# Patient Record
Sex: Male | Born: 1975 | Race: Black or African American | Hispanic: No | Marital: Married | Smoking: Current every day smoker
Health system: Southern US, Community
[De-identification: ages and names within clinical notes are randomized; demographics above are authoritative.]

## PROBLEM LIST (undated history)

## (undated) DIAGNOSIS — E119 Type 2 diabetes mellitus without complications: Secondary | ICD-10-CM

## (undated) DIAGNOSIS — I10 Essential (primary) hypertension: Secondary | ICD-10-CM

## (undated) DIAGNOSIS — R51 Headache: Secondary | ICD-10-CM

## (undated) DIAGNOSIS — R519 Headache, unspecified: Secondary | ICD-10-CM

## (undated) HISTORY — PX: ANKLE SURGERY: SHX546

## (undated) HISTORY — PX: OTHER SURGICAL HISTORY: SHX169

---

## 2000-12-14 ENCOUNTER — Emergency Department (HOSPITAL_COMMUNITY): Admission: EM | Admit: 2000-12-14 | Discharge: 2000-12-15 | Payer: Self-pay | Admitting: Emergency Medicine

## 2001-04-11 ENCOUNTER — Emergency Department (HOSPITAL_COMMUNITY): Admission: EM | Admit: 2001-04-11 | Discharge: 2001-04-11 | Payer: Self-pay | Admitting: Emergency Medicine

## 2008-10-14 ENCOUNTER — Emergency Department (HOSPITAL_COMMUNITY): Admission: EM | Admit: 2008-10-14 | Discharge: 2008-10-15 | Payer: Self-pay | Admitting: Emergency Medicine

## 2008-10-17 ENCOUNTER — Emergency Department (HOSPITAL_COMMUNITY): Admission: EM | Admit: 2008-10-17 | Discharge: 2008-10-18 | Payer: Self-pay | Admitting: Emergency Medicine

## 2008-10-23 ENCOUNTER — Ambulatory Visit (HOSPITAL_BASED_OUTPATIENT_CLINIC_OR_DEPARTMENT_OTHER): Admission: RE | Admit: 2008-10-23 | Discharge: 2008-10-23 | Payer: Self-pay | Admitting: Urology

## 2009-01-04 ENCOUNTER — Emergency Department (HOSPITAL_COMMUNITY): Admission: EM | Admit: 2009-01-04 | Discharge: 2009-01-04 | Payer: Self-pay | Admitting: Family Medicine

## 2009-01-10 ENCOUNTER — Emergency Department (HOSPITAL_COMMUNITY): Admission: EM | Admit: 2009-01-10 | Discharge: 2009-01-11 | Payer: Self-pay | Admitting: Emergency Medicine

## 2009-01-14 ENCOUNTER — Ambulatory Visit (HOSPITAL_COMMUNITY): Admission: RE | Admit: 2009-01-14 | Discharge: 2009-01-14 | Payer: Self-pay | Admitting: Internal Medicine

## 2010-04-17 ENCOUNTER — Encounter: Payer: Self-pay | Admitting: Internal Medicine

## 2010-07-03 LAB — DIFFERENTIAL
Basophils Absolute: 0 10*3/uL (ref 0.0–0.1)
Basophils Absolute: 0 10*3/uL (ref 0.0–0.1)
Basophils Relative: 0 % (ref 0–1)
Eosinophils Absolute: 0.1 10*3/uL (ref 0.0–0.7)
Eosinophils Relative: 1 % (ref 0–5)
Lymphocytes Relative: 13 % (ref 12–46)
Lymphocytes Relative: 38 % (ref 12–46)
Monocytes Absolute: 0.5 10*3/uL (ref 0.1–1.0)
Monocytes Absolute: 0.6 10*3/uL (ref 0.1–1.0)
Monocytes Relative: 5 % (ref 3–12)
Monocytes Relative: 6 % (ref 3–12)
Neutro Abs: 9.1 10*3/uL — ABNORMAL HIGH (ref 1.7–7.7)
Neutrophils Relative %: 51 % (ref 43–77)
Neutrophils Relative %: 80 % — ABNORMAL HIGH (ref 43–77)

## 2010-07-03 LAB — CBC
HCT: 49 % (ref 39.0–52.0)
HCT: 52.9 % — ABNORMAL HIGH (ref 39.0–52.0)
MCHC: 32.5 g/dL (ref 30.0–36.0)
MCV: 84 fL (ref 78.0–100.0)
Platelets: 215 10*3/uL (ref 150–400)
RBC: 5.83 MIL/uL — ABNORMAL HIGH (ref 4.22–5.81)
RDW: 14.4 % (ref 11.5–15.5)
WBC: 11.4 10*3/uL — ABNORMAL HIGH (ref 4.0–10.5)
WBC: 8.3 10*3/uL (ref 4.0–10.5)

## 2010-07-03 LAB — URINALYSIS, ROUTINE W REFLEX MICROSCOPIC
Bilirubin Urine: NEGATIVE
Glucose, UA: NEGATIVE mg/dL
Ketones, ur: NEGATIVE mg/dL
Protein, ur: 100 mg/dL — AB
Specific Gravity, Urine: 1.011 (ref 1.005–1.030)
Urobilinogen, UA: 0.2 mg/dL (ref 0.0–1.0)
Urobilinogen, UA: 1 mg/dL (ref 0.0–1.0)

## 2010-07-03 LAB — GLUCOSE, CAPILLARY: Glucose-Capillary: 127 mg/dL — ABNORMAL HIGH (ref 70–99)

## 2010-07-03 LAB — POCT I-STAT, CHEM 8
BUN: 15 mg/dL (ref 6–23)
Calcium, Ion: 1.14 mmol/L (ref 1.12–1.32)
Chloride: 104 mEq/L (ref 96–112)
Chloride: 105 mEq/L (ref 96–112)
Creatinine, Ser: 1.2 mg/dL (ref 0.4–1.5)
Creatinine, Ser: 1.5 mg/dL (ref 0.4–1.5)
Hemoglobin: 19 g/dL — ABNORMAL HIGH (ref 13.0–17.0)
Sodium: 140 mEq/L (ref 135–145)
Sodium: 142 mEq/L (ref 135–145)

## 2010-07-03 LAB — URINE MICROSCOPIC-ADD ON

## 2010-07-03 LAB — URINE CULTURE
Colony Count: NO GROWTH
Colony Count: NO GROWTH

## 2010-08-09 NOTE — Op Note (Signed)
Gerald Moody, Gerald Moody                ACCOUNT NO.:  [id number]   MEDICAL RECORD NO.:  [id number]          PATIENT TYPE:  AMB   LOCATION:  [facility]                         FACILITY:  [facility]   PHYSICIAN:  Ronald L. Earlene Plater, M.D.  DATE OF BIRTH:  [birth year]   DATE OF PROCEDURE:  10/23/2008  DATE OF DISCHARGE:                               OPERATIVE REPORT   DIAGNOSES:  Left ureterolithiasis with multiple stones and left  hydronephrosis.   OPERATIVE PROCEDURE:  Cystourethroscopy, left rigid and flexible digital  ureterorenoscopy, holmium laser lithotripsy, basket stone extraction,  left retrograde ureteral pyelogram and placement of left double-J stent.   SURGEON:  Gaynelle Arabian.   ANESTHESIA:  LMA.   BLOOD LOSS:  Negligible.   TUBES:  26-cm 6-French contour double pigtail stent with pullout string.   COMPLICATIONS:  None.   INDICATIONS FOR PROCEDURE:  Gerald Moody is a very nice 35 year old white  male who presented with left flank pain and some nausea.  He  subsequently on CT scan was found have a 3-mm left distal ureteral  calculus and a 6-mm left UPJ stone.  He has not passed fragments  although he had some blood in his urine, and he thinks he may have  passed a small fragment.  He has not had much pain in the last few days.  With these 2 obstructive stones, we felt removal was indicated.  He  understands risks, benefits and alternatives and wishes to proceed.   PROCEDURE IN DETAIL:  The patient was placed in supine position.  After  proper LMA anesthesia, he was placed in the dorsal lithotomy position  and prepped and draped with Betadine in sterile fashion.  Cystourethroscopy was performed with a 22.5 [place] Olympus panendoscope.  The prostate was not enlarged, and the bladder was smooth walled without  lesions.  The urine had some blood, and there was some bloody efflux  from the left ureteral orifice.  A 0.038 French sensor wire was placed  into the left renal pelvis, and the distal  ureter was dilated with the  dilating portion of ureteral access catheter.  Ureteroscopy was then  performed with a short thin ureteroscope, and the distal stone was  visualized and noted to be approximately 4 mm in diameter.  It was  grasped with a nitinol basket and extracted.  Ureteroscopy was then  performed with the long thin ureteroscope.  We scoped up to the lower  ureter.  The stone could be seen at the ureteropelvic junction, but the  scope could not be passed any higher due to the length of the scope.  Scope was then visually removed.  A long digital ureteral access sheath  was placed up to the ureteropelvic junction, and the flexible digital  ureteroscope was performed.  The stone was in the renal pelvis, and it  was noted to be quite large, approximately 8-9 mm in diameter.  Utilizing the 200 micron laser fiber on a setting of 0.5 and a  repetition rate of 5, the stone was fragmented in multiple small  fragments, and each fragment was serially extracted with a  nitinol  basket.  Reinspection revealed there were no significant fragments  within the left renal pelvis.  Each calyces was visualized.  A 0.038  French sensor wire was then placed through the scope into the pelvis,  and the ureteroscope and the ureteral access sheath visualized all the  way out and no further fragments remaining within the ureter.  A 6-  [place] open-ended catheter was placed in the left renal pelvis with a  wire, and retrograde ureteral pyelogram was performed.  There was no  extravasation noted and no filling defects or other fragments.  The wire  was placed, and under fluoroscopic guidance, a 26-cm 6-French contour  double pigtail stent was placed and  noted to be in good position from the left renal pelvis within the  bladder.  Pullout string was attached.  The bladder was drained.  The  panendoscope was removed.  A string was taped to the penis.  The  location of stent was confirmed  fluoroscopically, and the patient was  taken to the recovery room stable.      Ronald L. Earlene Plater, M.D.  Electronically Signed     RLD/MEDQ  D:  10/23/2008  T:  10/24/2008  Job:  [id number]

## 2012-12-04 ENCOUNTER — Emergency Department (HOSPITAL_COMMUNITY)
Admission: EM | Admit: 2012-12-04 | Discharge: 2012-12-04 | Disposition: A | Discharge status: Home / Self Care | Payer: Managed Care, Other (non HMO) | Attending: Emergency Medicine | Admitting: Emergency Medicine

## 2012-12-04 ENCOUNTER — Encounter (HOSPITAL_COMMUNITY): Payer: Self-pay | Admitting: Emergency Medicine

## 2012-12-04 DIAGNOSIS — F17200 Nicotine dependence, unspecified, uncomplicated: Secondary | ICD-10-CM | POA: Insufficient documentation

## 2012-12-04 DIAGNOSIS — E119 Type 2 diabetes mellitus without complications: Secondary | ICD-10-CM | POA: Insufficient documentation

## 2012-12-04 DIAGNOSIS — R51 Headache: Secondary | ICD-10-CM | POA: Insufficient documentation

## 2012-12-04 DIAGNOSIS — H53149 Visual discomfort, unspecified: Secondary | ICD-10-CM | POA: Insufficient documentation

## 2012-12-04 DIAGNOSIS — R5381 Other malaise: Secondary | ICD-10-CM | POA: Insufficient documentation

## 2012-12-04 DIAGNOSIS — R209 Unspecified disturbances of skin sensation: Secondary | ICD-10-CM | POA: Insufficient documentation

## 2012-12-04 DIAGNOSIS — R112 Nausea with vomiting, unspecified: Secondary | ICD-10-CM | POA: Insufficient documentation

## 2012-12-04 HISTORY — DX: Type 2 diabetes mellitus without complications: E11.9

## 2012-12-04 HISTORY — DX: Headache: R51

## 2012-12-04 HISTORY — DX: Headache, unspecified: R51.9

## 2012-12-04 MED ORDER — SODIUM CHLORIDE 0.9 % IV BOLUS (SEPSIS)
1000.0000 mL | Freq: Once | INTRAVENOUS | Status: AC
  Administered 2012-12-04: 1000 mL via INTRAVENOUS

## 2012-12-04 MED ORDER — KETOROLAC TROMETHAMINE 30 MG/ML IJ SOLN
30.0000 mg | Freq: Once | INTRAMUSCULAR | Status: AC
  Administered 2012-12-04: 30 mg via INTRAVENOUS
  Filled 2012-12-04: qty 1

## 2012-12-04 MED ORDER — DIPHENHYDRAMINE HCL 50 MG/ML IJ SOLN
25.0000 mg | Freq: Once | INTRAMUSCULAR | Status: AC
  Administered 2012-12-04: 25 mg via INTRAVENOUS
  Filled 2012-12-04: qty 1

## 2012-12-04 MED ORDER — SUMATRIPTAN SUCCINATE 100 MG PO TABS: 100.0000 mg | ORAL_TABLET | ORAL | Status: DC | PRN

## 2012-12-04 MED ORDER — METOCLOPRAMIDE HCL 5 MG/ML IJ SOLN
10.0000 mg | Freq: Once | INTRAMUSCULAR | Status: AC
  Administered 2012-12-04: 10 mg via INTRAVENOUS
  Filled 2012-12-04: qty 2

## 2012-12-04 MED ORDER — DEXAMETHASONE SODIUM PHOSPHATE 10 MG/ML IJ SOLN
10.0000 mg | Freq: Once | INTRAMUSCULAR | Status: AC
  Administered 2012-12-04: 10 mg via INTRAVENOUS
  Filled 2012-12-04: qty 1

## 2012-12-04 NOTE — ED Provider Notes (Signed)
CSN: [id number]     Arrival date & time 12/04/12  2005 History   First MD Initiated Contact with Patient 12/04/12 2212     Chief Complaint  Patient presents with  . Headache   HPI  History provided by the patient. The patient is a 37 year old male with history of diabetes and previous migraine-type headaches who presents with complaints of similar migraine headache. Headache has been present with some waxing and waning features for the past 4 days. Patient states he initially had very severe pain to the left side of his head and behind his left eye lasting 15-20 minutes. Headache is preceded by any tingling and hot sensation to his left maxillary area. Since then the headache has been coming and going with varying intensities. Patient did try to take aspirin earlier today but reports he has had associated nausea and vomiting and did not keep this down. He has had multiple episodes of vomiting with decreased appetite today. No diarrhea. No fever, chills or sweats. No weakness or numbness in extremities. No confusion. Symptoms are worsened with some movements and bright lights. Patient does report using Imitrex in the past which did tend to help with his symptoms at home.    Past Medical History  Diagnosis Date  . Headache   . Diabetes mellitus without complication    History reviewed. No pertinent past surgical history. No family history on file. History  Substance Use Topics  . Smoking status: Current Every Day Smoker  . Smokeless tobacco: Not on file  . Alcohol Use: Yes    Review of Systems  Constitutional: Positive for fatigue. Negative for fever and diaphoresis.  Eyes: Positive for photophobia. Negative for visual disturbance.  Gastrointestinal: Positive for nausea and vomiting.  Neurological: Positive for headaches. Negative for numbness.  All other systems reviewed and are negative.    Allergies  Review of patient's allergies indicates no known allergies.  Home Medications    Current Outpatient Rx  Name  Route  Sig  Dispense  Refill  . latanoprost (XALATAN) 0.005 % ophthalmic solution   Both Eyes   Place 1 drop into both eyes at bedtime.         . metFORMIN (GLUCOPHAGE) 500 MG tablet   Oral   Take 500 mg by mouth 2 (two) times daily with a meal.          BP 142/90  Pulse 88  Temp(Src) 98.9 F (37.2 C) (Oral)  Resp 16  Ht 5\' 10"  (1.778 m)  Wt 202 lb (91.627 kg)  BMI 28.98 kg/m2  SpO2 98% Physical Exam  Nursing note and vitals reviewed. Constitutional: He is oriented to person, place, and time. He appears well-developed and well-nourished. No distress.  HENT:  Head: Normocephalic and atraumatic.  No sinus pressure or nasal congestion  Eyes: Conjunctivae and EOM are normal. Pupils are equal, round, and reactive to light.  Neck: Normal range of motion. Neck supple.  No meningeal sign  Cardiovascular: Normal rate and regular rhythm.   Pulmonary/Chest: Effort normal and breath sounds normal. No respiratory distress. He has no wheezes.  Abdominal: Soft. There is no tenderness.  Musculoskeletal: Normal range of motion. He exhibits no edema and no tenderness.  Neurological: He is alert and oriented to person, place, and time. He has normal strength. No cranial nerve deficit or sensory deficit. Coordination and gait normal.  Skin: Skin is warm. No rash noted.  Psychiatric: He has a normal mood and affect. His behavior is normal.  ED Course  Procedures      MDM   1. Headache      10:10PM patient seen and evaluated. Patient well-appearing no acute distress. Does not appear severely ill or toxic. He has a normal nonfocal neuro exam. Headaches are similar to previous headaches. 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 Migraine cocktail ordered.  11:00PM Pt feeling much better.  He is ready to go home.    Angus Seller, PA-C 12/04/12 2314

## 2012-12-04 NOTE — ED Notes (Signed)
Pt. Stated, I've been having headaches since Monday.  I 've not taking anything until today and I took ASA and threw it back up.  I've had this when we change the season it comes on for the last 4 years.

## 2012-12-05 NOTE — ED Provider Notes (Signed)
Medical screening examination/treatment/procedure(s) were performed by non-physician practitioner and as supervising physician I was immediately available for consultation/collaboration.   Nelia Shi, MD 12/05/12 [phone]

## 2012-12-12 ENCOUNTER — Emergency Department (HOSPITAL_COMMUNITY): Payer: Managed Care, Other (non HMO)

## 2012-12-12 ENCOUNTER — Emergency Department (HOSPITAL_COMMUNITY)
Admission: EM | Admit: 2012-12-12 | Discharge: 2012-12-12 | Disposition: A | Discharge status: Home / Self Care | Payer: Managed Care, Other (non HMO) | Attending: Emergency Medicine | Admitting: Emergency Medicine

## 2012-12-12 ENCOUNTER — Encounter (HOSPITAL_COMMUNITY): Payer: Self-pay

## 2012-12-12 DIAGNOSIS — F17200 Nicotine dependence, unspecified, uncomplicated: Secondary | ICD-10-CM | POA: Insufficient documentation

## 2012-12-12 DIAGNOSIS — H53149 Visual discomfort, unspecified: Secondary | ICD-10-CM | POA: Insufficient documentation

## 2012-12-12 DIAGNOSIS — Z79899 Other long term (current) drug therapy: Secondary | ICD-10-CM | POA: Insufficient documentation

## 2012-12-12 DIAGNOSIS — G44009 Cluster headache syndrome, unspecified, not intractable: Secondary | ICD-10-CM

## 2012-12-12 DIAGNOSIS — J069 Acute upper respiratory infection, unspecified: Secondary | ICD-10-CM

## 2012-12-12 DIAGNOSIS — R112 Nausea with vomiting, unspecified: Secondary | ICD-10-CM | POA: Insufficient documentation

## 2012-12-12 DIAGNOSIS — H538 Other visual disturbances: Secondary | ICD-10-CM | POA: Insufficient documentation

## 2012-12-12 DIAGNOSIS — E119 Type 2 diabetes mellitus without complications: Secondary | ICD-10-CM | POA: Insufficient documentation

## 2012-12-12 MED ORDER — KETOROLAC TROMETHAMINE 30 MG/ML IJ SOLN
30.0000 mg | Freq: Once | INTRAMUSCULAR | Status: AC
  Administered 2012-12-12: 30 mg via INTRAVENOUS
  Filled 2012-12-12: qty 1

## 2012-12-12 MED ORDER — DIPHENHYDRAMINE HCL 50 MG/ML IJ SOLN
25.0000 mg | Freq: Once | INTRAMUSCULAR | Status: AC
  Administered 2012-12-12: 25 mg via INTRAVENOUS
  Filled 2012-12-12: qty 1

## 2012-12-12 MED ORDER — METOCLOPRAMIDE HCL 5 MG/ML IJ SOLN
10.0000 mg | Freq: Once | INTRAMUSCULAR | Status: AC
  Administered 2012-12-12: 10 mg via INTRAVENOUS
  Filled 2012-12-12: qty 2

## 2012-12-12 NOTE — ED Notes (Signed)
Marissa,PA at the bedside.  

## 2012-12-12 NOTE — ED Notes (Signed)
Patient c/o headache that started at 0200 this morning, patient was in last week for the same problem,

## 2012-12-12 NOTE — ED Notes (Signed)
Returned from xray

## 2012-12-12 NOTE — ED Notes (Signed)
Patient was taking Imitrex at home, but it was making the pt feel weak and drowsy, he no longer has any pills left. Pt developed cough with congestion yesterday

## 2012-12-12 NOTE — ED Provider Notes (Signed)
CSN: [id number]     Arrival date & time 12/12/12  2956 History   First MD Initiated Contact with Patient 12/12/12 [phone]     Chief Complaint  Patient presents with  . Headache  . Emesis   (Consider location/radiation/quality/duration/timing/severity/associated sxs/prior Treatment) The history is provided by the patient. No language interpreter was used.  Gerald Moody is a 37 y/o M with PMHx of headaches and DM presenting to the ED with headache that has been ongoing for the past 2 weeks, intermittently, episodes of headaches that have been occurring everyday lasting approximately 1 hour. Patient reported that the headache is localized to the left side of the head with feelings of pressure behind the left eye with mild tearing and swelling identified as per patient. Patient reported that he has been having photophobia, phonophobia, and mild blurred vision. Reported that he has been feel nauseous intermittently throughout the day with emesis one time this morning at approximately 3:00AM. Reported that his last headache started at 2:00AM and has been consistent since then. Patient reported that he was diagnosed with cluster headaches 4 years ago, does not take medications for it. Reported that he had been having chest soreness, associated with when the patient coughs. Reported that he has been coughing yesterday, coughing up yellow phlegm. Reported that he does have nasal congestion with yellow mucus. Reported that he has been having facial pressure bilaterally with chills. Denied facial numbness, tingling, facial drooping, slurred speech, neck stiffness, sick contacts, sore throat, difficulty swallowing, falls, head injury, spots in vision, weakness, sudden loss of vision. PCP none   Past Medical History  Diagnosis Date  . Headache   . Diabetes mellitus without complication    History reviewed. No pertinent past surgical history. History reviewed. No pertinent family history. History  Substance Use  Topics  . Smoking status: Current Every Day Smoker  . Smokeless tobacco: Not on file  . Alcohol Use: Yes    Review of Systems  Constitutional: Positive for chills. Negative for fever.  HENT: Negative for neck pain and neck stiffness.   Eyes: Positive for visual disturbance.  Respiratory: Positive for cough. Negative for chest tightness.   Cardiovascular: Negative for chest pain.  Gastrointestinal: Positive for nausea and vomiting. Negative for abdominal pain.  Neurological: Positive for headaches. Negative for dizziness, weakness and numbness.  All other systems reviewed and are negative.    Allergies  Review of patient's allergies indicates no known allergies.  Home Medications   Current Outpatient Rx  Name  Route  Sig  Dispense  Refill  . latanoprost (XALATAN) 0.005 % ophthalmic solution   Both Eyes   Place 1 drop into both eyes at bedtime.         . metFORMIN (GLUCOPHAGE) 500 MG tablet   Oral   Take 500 mg by mouth 2 (two) times daily with a meal.         . SUMAtriptan (IMITREX) 100 MG tablet   Oral   Take 1 tablet (100 mg total) by mouth every 4 (four) hours as needed for migraine.   10 tablet   0    BP 122/77  Pulse 72  Temp(Src) 99.8 F (37.7 C) (Oral)  Resp 16  Ht 5\' 10"  (1.778 m)  Wt 202 lb (91.627 kg)  BMI 28.98 kg/m2  SpO2 98% Physical Exam  Nursing note and vitals reviewed. Constitutional: He is oriented to person, place, and time. He appears well-developed and well-nourished. No distress.  HENT:  Head: Normocephalic and  atraumatic.  Mouth/Throat: Oropharynx is clear and moist. No oropharyngeal exudate.  Discomfort upon palpation to the maxillary and frontal sinuses Nasal congestion identified  Eyes: Conjunctivae and EOM are normal. Pupils are equal, round, and reactive to light. Right eye exhibits no discharge. Left eye exhibits no discharge.  Negative swelling, erythema noted to the eyes bilaterally Mild lacrimation noted to the left eye.  Mild tearing noted.  Neck: Normal range of motion. Neck supple.  Negative neck stiffness Negative nuchal rigidity Negative pain upon palpation to the cervical spine Negative lymphadenopathy noted  Cardiovascular: Normal rate, regular rhythm and normal heart sounds.  Exam reveals no friction rub.   No murmur heard. Pulses:      Radial pulses are 2+ on the right side, and 2+ on the left side.       Dorsalis pedis pulses are 2+ on the right side, and 2+ on the left side.  Pulmonary/Chest: Effort normal and breath sounds normal. No respiratory distress. He has no wheezes. He has no rales.  Negative signs of respiratory distress.  Musculoskeletal: Normal range of motion.  Lymphadenopathy:    He has no cervical adenopathy.  Neurological: He is alert and oriented to person, place, and time. No cranial nerve deficit. He exhibits normal muscle tone. Coordination normal. GCS eye subscore is 4. GCS verbal subscore is 5. GCS motor subscore is 6.  Cranial nerves III-XII grossly intact  Sensation intact to upper and lower extremities Strength 5+/5+ to upper and lower extremities bilaterally with resistance applied, equal distribution noted Negative facial drooping, negative slurred speech Negative arm drift Responds to questions appropriately  Follows commands appropriately  Skin: Skin is warm and dry. He is not diaphoretic.  Psychiatric: He has a normal mood and affect. His behavior is normal. Thought content normal.    ED Course  Procedures (including critical care time)  This provider reviewed patient's chart. Patient was seen on 12/04/2012 for same complaint regarding headache. Patient reported that the headache has been intermittent with waxing and waning episodes of headache localized to the left side with eye discomfort, rhinorrhea, lacrimation noted.  11:33 AM Spoke with patient. Patient reported that he is feeling better, that is headache is a 2/10. Negative episodes of emesis. Nausea  controlled. Patient able to tolerate fluids by mouth.  Discussed case with Dr. Leander Rams, as per physician did not recommend CT head to be performed at this time.   Labs Review Labs Reviewed - No data to display Imaging Review Dg Chest 2 View  12/12/2012   CLINICAL DATA:  Chest pain, headache.  EXAM: CHEST  2 VIEW  COMPARISON:  10/23/2008  FINDINGS: Mild peribronchial thickening. Heart and mediastinal contours are within normal limits. No focal opacities or effusions. No acute bony abnormality.  IMPRESSION: Bronchitic changes.   Electronically Signed   By: Charlett Nose M.D.   On: 12/12/2012 09:37    MDM   1. Cluster headache   2. URI (upper respiratory infection)     Patient presenting to emergency department with headache that has been ongoing for the past 2 weeks. Patient reports that he gets these headaches every other month, reported that the headache is the same presentation as his normal headache that he gets monthly. Denied maintenance therapy. Alert and oriented. Eyes normal, PERRLA bilaterally. Negative neck stiffness, negative nuchal rigidity, negative cervical lymphadenopathy-negative pain upon palpation to cervical spine. Full range of motion to upper and lower extremities bilaterally. Cranial nerves grossly intact. Negative neurological deficits identified. Fine  motor skills intact. Responds to questions appropriately. Follows commands properly. Patient stable, afebrile. Headache controlled in ED setting. Negative neurological deficits identified. Negative meningeal signs. Doubt SAH. Doubt ICH. Doubt stroke. Doubt meningitis. Suspicion to be cluster headache presentation, patient has long history approximately 4 years this has been ongoing. Suspicion to be upper respiratory infection, viral in nature. Doubt ACS, reported that chest pain is associated when patient coughs only. Discharged patient. Discussed with patient to continue to take sumatriptan that he was prescribed from previous  visit in the emergency department on 12/04/2012. [facility]-discussed with patient he will most likely need to be placed on maintenance therapy regarding control of his cluster headaches. Discussed with patient to rest and stay hydrated. Discussed with patient to continue to monitor symptoms and if symptoms are to worsen or change to report back to emergency department-strict return instructions given. Patient agreed to plan of care, understood, all questions answered.    Raymon Mutton, PA-C 12/12/12 1857

## 2012-12-14 NOTE — ED Provider Notes (Signed)
Medical screening examination/treatment/procedure(s) were performed by non-physician practitioner and as supervising physician I was immediately available for consultation/collaboration.   Gavin Pound. Shaday Rayborn, MD 12/14/12 1550

## 2013-01-06 ENCOUNTER — Emergency Department (HOSPITAL_COMMUNITY)
Admission: EM | Admit: 2013-01-06 | Discharge: 2013-01-07 | Disposition: A | Discharge status: Home / Self Care | Payer: Managed Care, Other (non HMO) | Attending: Emergency Medicine | Admitting: Emergency Medicine

## 2013-01-06 ENCOUNTER — Encounter (HOSPITAL_COMMUNITY): Payer: Self-pay | Admitting: Emergency Medicine

## 2013-01-06 DIAGNOSIS — F17200 Nicotine dependence, unspecified, uncomplicated: Secondary | ICD-10-CM | POA: Insufficient documentation

## 2013-01-06 DIAGNOSIS — R51 Headache: Secondary | ICD-10-CM | POA: Insufficient documentation

## 2013-01-06 DIAGNOSIS — E119 Type 2 diabetes mellitus without complications: Secondary | ICD-10-CM | POA: Insufficient documentation

## 2013-01-06 DIAGNOSIS — Z79899 Other long term (current) drug therapy: Secondary | ICD-10-CM | POA: Insufficient documentation

## 2013-01-06 DIAGNOSIS — Z720 Tobacco use: Secondary | ICD-10-CM

## 2013-01-06 LAB — GLUCOSE, CAPILLARY

## 2013-01-06 MED ORDER — KETOROLAC TROMETHAMINE 30 MG/ML IJ SOLN
15.0000 mg | Freq: Once | INTRAMUSCULAR | Status: DC
  Filled 2013-01-06: qty 1

## 2013-01-06 MED ORDER — SODIUM CHLORIDE 0.9 % IV BOLUS (SEPSIS): 1000.0000 mL | Freq: Once | INTRAVENOUS | Status: DC

## 2013-01-06 MED ORDER — PROCHLORPERAZINE EDISYLATE 5 MG/ML IJ SOLN
10.0000 mg | Freq: Once | INTRAMUSCULAR | Status: DC
  Filled 2013-01-06: qty 2

## 2013-01-06 NOTE — ED Provider Notes (Signed)
CSN: [id number]     Arrival date & time 01/06/13  1821 History   First MD Initiated Contact with Patient 01/06/13 2237     Chief Complaint  Patient presents with  . Headache   (Consider location/radiation/quality/duration/timing/severity/associated sxs/prior Treatment) HPI  Gerald Moody is a 37 y.o. male past medical history significant for non-insulin-dependent diabetes complaining of typical headache which she has had for over a month. Patient presents to the ED because he wants his head looks that any further evaluation of his brain. Patient has called his primary care doctor and has asked for neuro referral but this has not happened. Patient has been out of work since September 9 due to the severity of the headache. Patient has left-sided periorbital and temporal headache intermittently, with photophobia and nausea and vomiting. Pain is alleviated with Imitrex and home O2. Patient denies fever, cervicalgia, confusion, acute onset, change in character, location or severity of headache.   Past Medical History  Diagnosis Date  . Headache   . Diabetes mellitus without complication    History reviewed. No pertinent past surgical history. History reviewed. No pertinent family history. History  Substance Use Topics  . Smoking status: Current Every Day Smoker  . Smokeless tobacco: Not on file  . Alcohol Use: Yes    Review of Systems 10 systems reviewed and found to be negative, except as noted in the HPI  Allergies  Review of patient's allergies indicates no known allergies.  Home Medications   Current Outpatient Rx  Name  Route  Sig  Dispense  Refill  . latanoprost (XALATAN) 0.005 % ophthalmic solution   Both Eyes   Place 1 drop into both eyes at bedtime.         . metFORMIN (GLUCOPHAGE) 500 MG tablet   Oral   Take 500 mg by mouth 2 (two) times daily with a meal.         . SUMAtriptan (IMITREX) 100 MG tablet   Oral   Take 1 tablet (100 mg total) by mouth every 4 (four)  hours as needed for migraine.   10 tablet   0    BP 149/101  Pulse 69  Temp(Src) 97.9 F (36.6 C) (Oral)  Resp 18  Ht 5\' 10"  (1.778 m)  Wt 191 lb (86.637 kg)  BMI 27.41 kg/m2  SpO2 97% Physical Exam  Nursing note and vitals reviewed. Constitutional: He is oriented to person, place, and time. He appears well-developed and well-nourished. No distress.  HENT:  Head: Normocephalic and atraumatic.  Mouth/Throat: Oropharynx is clear and moist.  Eyes: Conjunctivae and EOM are normal. Pupils are equal, round, and reactive to light. Right eye exhibits no discharge. Left eye exhibits no discharge.  Neck: Normal range of motion. Neck supple.  No midline tenderness to palpation or step-offs appreciated. Patient has full range of motion without pain.   Cardiovascular: Normal rate, regular rhythm and intact distal pulses.   Pulmonary/Chest: Effort normal and breath sounds normal. No stridor. No respiratory distress. He has no wheezes. He has no rales. He exhibits no tenderness.  Abdominal: Soft. He exhibits no distension and no mass. There is no tenderness. There is no rebound and no guarding.  Musculoskeletal: Normal range of motion. He exhibits no edema.  Neurological: He is alert and oriented to person, place, and time. He displays normal reflexes. No cranial nerve deficit. He exhibits normal muscle tone. Coordination normal.  Cranial nerves III through XII intact, strength 5 out of 5x4 extremities, negative pronator drift,  finger to nose and heel-to-shin coordinated, sensation intact to pinprick and light touch, gait is coordinated and Romberg is negative.    Psychiatric: He has a normal mood and affect.    ED Course  Procedures (including critical care time) Labs Review Labs Reviewed  GLUCOSE, CAPILLARY - Abnormal; Notable for the following:    Glucose-Capillary 178 (*)    All other components within normal limits   Imaging Review No results found.  EKG Interpretation   None        MDM   1. Headache   2. Tobacco use     Filed Vitals:   01/06/13 1840  BP: 159/102  Pulse: 74  Temp: 98.2 F (36.8 C)  TempSrc: Oral  Resp: 18  Height: 5\' 10"  (1.778 m)  Weight: 191 lb (86.637 kg)  SpO2: 98%     Gerald Moody is a 37 y.o. male with  HA treated and improved while in ED.  Presentation is like pts typical HA and non concerning for [facility], ICH, Meningitis, or temporal arteritis. Pt is afebrile with no focal neuro deficits, nuchal rigidity, or change in vision. Patient states he would like to get to the bottom of his chronic headaches. I have explained to him that he must follow with neurology as an outpatient. I am happy to give him a referral for this. He has deferred pain medications in the ED. Pt verbalizes understanding and is agreeable with plan to dc.   Pt is hemodynamically stable, appropriate for, and amenable to discharge at this time. Pt verbalized understanding and agrees with care plan. All questions answered. Outpatient follow-up and specific return precautions discussed.    New Prescriptions   BUTALBITAL-ACETAMINOPHEN-CAFFEINE (FIORICET) 50-325-40 MG PER TABLET    Take 1 tablet by mouth every 6 (six) hours as needed for headache.    Note: Portions of this report may have been transcribed using voice recognition software. Every effort was made to ensure accuracy; however, inadvertent computerized transcription errors may be present        Wynetta Emery, PA-C 01/07/13 0011

## 2013-01-06 NOTE — ED Notes (Addendum)
Pt refusing IV and IV pain meds, states "that's what they do every time I come here, I want to be scanned, I don't want IV medication that drugs me up."

## 2013-01-06 NOTE — ED Notes (Signed)
Pt c/o left sided HA intermittently x 1 month; pt seen here for same x 3; pt sts hx of DM

## 2013-01-06 NOTE — ED Notes (Signed)
EDPA Nicole at bedside. 

## 2013-01-06 NOTE — ED Notes (Signed)
Pt reports intermittent Left side headache behind his eye, jaw bone, posterior neck 2-3 times pt reports having these symptoms on and off x1 month. Pt denies N/V or sensitivity to light.

## 2013-01-07 MED ORDER — BUTALBITAL-APAP-CAFFEINE 50-325-40 MG PO TABS: 1.0000 | ORAL_TABLET | Freq: Four times a day (QID) | ORAL | Status: DC | PRN

## 2013-01-07 NOTE — ED Provider Notes (Signed)
Medical screening examination/treatment/procedure(s) were performed by non-physician practitioner and as supervising physician I was immediately available for consultation/collaboration.   Junius Argyle, MD 01/07/13 [phone]

## 2013-01-07 NOTE — ED Notes (Signed)
Nicole at bedside, thoroughly explained to pt why he would not be receiving a CT scan of his head tonight in the ED, she would give him a referral to a neurologist to have further evaluation.

## 2013-01-08 ENCOUNTER — Encounter (INDEPENDENT_AMBULATORY_CARE_PROVIDER_SITE_OTHER): Payer: Self-pay

## 2013-01-08 ENCOUNTER — Encounter: Payer: Self-pay | Admitting: Neurology

## 2013-01-08 ENCOUNTER — Ambulatory Visit (INDEPENDENT_AMBULATORY_CARE_PROVIDER_SITE_OTHER): Payer: Managed Care, Other (non HMO) | Admitting: Neurology

## 2013-01-08 VITALS — BP 153/101 | HR 62 | Ht 70.0 in | Wt 190.0 lb

## 2013-01-08 VITALS — Ht 68.0 in | Wt 190.0 lb

## 2013-01-08 DIAGNOSIS — G44009 Cluster headache syndrome, unspecified, not intractable: Secondary | ICD-10-CM | POA: Insufficient documentation

## 2013-01-08 MED ORDER — RIZATRIPTAN BENZOATE 5 MG PO TBDP: 5.0000 mg | ORAL_TABLET | ORAL | Status: DC | PRN

## 2013-01-08 MED ORDER — LIDOCAINE HCL 2 % IJ SOLN: 5.0000 mL | Freq: Once | INTRAMUSCULAR | Status: DC

## 2013-01-08 MED ORDER — TOPIRAMATE ER 100 MG PO CAP24: 100.0000 mg | ORAL_CAPSULE | Freq: Every day | ORAL | Status: DC

## 2013-01-08 NOTE — Progress Notes (Signed)
[facility]  PATIENT: Yunus Stoklosa DOB: 1976-02-07  HISTORICAL Daisuke is a 37 years old right-handed African American male, referred by emergency room for evaluation of headaches  He had past medical history of hypertension, diabetes, presenting with left-sided headaches in September 2014, he complains of left frontal area severe pounding headache, with associated left-sided tearing, running nose, radiating pain to his left parietal region, left shoulder, left arm. He also complains of nausea vomitting during the episodes    He has 2-3 episodes of headache each day, it is usually happened late night, or early morning times, each episode lasted about 20-30 minutes, oxygen helps, Imitrex helps too. He tends to pace around during his headaches   REVIEW OF SYSTEMS: Full 14 system review of systems performed and notable only for weight loss, fatigue, blurry vision, feeling hot, change in appetite, confusion, headaches, weakness, sleepiness  ALLERGIES: No Known Allergies  HOME MEDICATIONS: Outpatient Prescriptions Prior to Visit  Medication Sig Dispense Refill  . butalbital-acetaminophen-caffeine (FIORICET) 50-325-40 MG per tablet Take 1 tablet by mouth every 6 (six) hours as needed for headache.  15 tablet  0  . latanoprost (XALATAN) 0.005 % ophthalmic solution Place 1 drop into both eyes at bedtime.      . metFORMIN (GLUCOPHAGE) 500 MG tablet Take 500 mg by mouth 2 (two) times daily with a meal.      . SUMAtriptan (IMITREX) 100 MG tablet Take 1 tablet (100 mg total) by mouth every 4 (four) hours as needed for migraine.  10 tablet  0   No facility-administered medications prior to visit.    PAST MEDICAL HISTORY: Past Medical History  Diagnosis Date  . Headache   . Diabetes mellitus without complication     PAST SURGICAL HISTORY: Past Surgical History  Procedure Laterality Date  . Ankle surgery Left   . Kidney stones      FAMILY HISTORY: Family History    Problem Relation Age of Onset  . Diabetes Mother   . Diabetes Father   . High blood pressure Father     SOCIAL HISTORY:  History   Social History  . Marital Status: Married    Spouse Name: [name]    Number of Children: 2  . Years of Education: 12   Occupational History  .  [employer]   Social History Main Topics  . Smoking status: Current Every Day Smoker -- 10 years    Types: Cigarettes  . Smokeless tobacco: Never Used  . Alcohol Use: 0.6 oz/week    1 Shots of liquor per week     Comment: Rare  . Drug Use: No  . Sexual Activity: Not on file   Other Topics Concern  . Not on file   Social History Narrative   Patient lives at home with his wife [name]). Patient works full time.    High school education   Right handed.   Caffeine.One soda daily     PHYSICAL EXAM   Filed Vitals:   01/08/13 0848  Height: 5\' 8"  (1.727 m)  Weight: 190 lb (86.183 kg)   Body mass index is 28.9 kg/(m^2).   Generalized: In no acute distress  Neck: Supple, no carotid bruits   Cardiac: Regular rate rhythm  Pulmonary: Clear to auscultation bilaterally  Musculoskeletal: No deformity  Neurological examination  Mentation: Alert oriented to time, place, history taking, and causual conversation  Cranial nerve II-XII: Pupils were equal round reactive to light extraocular movements were full, visual  field were full on confrontational test. facial sensation and strength were normal. hearing was intact to finger rubbing bilaterally. Uvula tongue midline.  head turning and shoulder shrug and were normal and symmetric.Tongue protrusion into cheek strength was normal.  Motor: normal tone, bulk and strength.  Sensory: Intact to fine touch, pinprick, preserved vibratory sensation, and proprioception at toes.  Coordination: Normal finger to nose, heel-to-shin bilaterally there was no truncal ataxia  Gait: Rising up from seated position without assistance, normal stance, without trunk  ataxia, moderate stride, good arm swing, smooth turning, able to perform tiptoe, and heel walking without difficulty.   Romberg signs: Negative  Deep tendon reflexes: Brachioradialis 2/2, biceps 2/2, triceps 2/2, patellar 2/2, Achilles 2/2, plantar responses were flexor bilaterally.   DIAGNOSTIC DATA (LABS, IMAGING, TESTING) - I reviewed patient records, labs, notes, testing and imaging myself where available.  Lab Results  Component Value Date   WBC 8.3 10/18/2008   HGB 16.7 10/18/2008   HCT 49.0 10/18/2008   MCV 84.0 10/18/2008   PLT 215 10/18/2008      Component Value Date/Time   NA 140 10/18/2008 0104   K 4.0 10/18/2008 0104   CL 105 10/18/2008 0104   GLUCOSE 160* 10/18/2008 0104   BUN 15 10/18/2008 0104   CREATININE 1.2 10/18/2008 0104    ASSESSMENT AND PLAN  37 years old right-handed [place] American male, with cluster headache, involving his left side, normal neurological examination.  1. MRI of brain to rule out structural lesion. 2. Sphenocath blocking procedure 3. Trokendi xr 100mg  qday, Maxalt prn   Levert Feinstein, M.D. Ph.D.  [facility] [address], [address] [place], [place] [address] [phone]

## 2013-01-08 NOTE — Progress Notes (Signed)
[facility]   Provider:  Dr Hosie Poisson Referring Provider: Jackie Plum, MD Primary Care Physician:  Jackie Plum, MD  CC:  SPG block for cluster headache  HPI:  Gerald Moody is a 37 y.o. male here for initial evaluation for SPG block for cluster headache. Referral from Dr Terrace Arabia.   Patient notes severe refractory headaches, responsive to oxygen therapy but no other medications. Having autonomic features with the headache.   Contraindications and precautions discussed with patient. Aseptic procedure was observed and patient tolerated procedure. Procedure performed by Dr. Elspeth Cho.  The condition has existed for more than 6 months   Indication/Diagnosis:  Cluster headaches  Exam: Gen:NAD, pleasant  Neuro: Alert, oriented x 3, follows simple and mult step commands  CN: PERRL, EOMI, no facial asymmetry  Motor: moves all extremities spontaneously   Procedure Report:  Patient taken to procedure room, placed on table in supine position with a pillow supporting the shoulders, and allowing the back of the head to rest on the procedural table, which facilitated gentle neck extension.  Alcohol wipes were generously applied to the cutaneous structures of the maxillary region of the face. A temperature reading sticker was placed on both sides of the face, in the V2 distribution and baseline cutaneous temperatures were recorded. Right was 92, left was 92.  Both nostrils were then visually examined to check for signs of recent epistaxis, purulent nasal drainage, and/or visible nasal sinus anatomy abnormalities or nares obstruction. No abnormalities were visualized. Just prior to insertion of sphenocath, the patients head was rechecked for proper supine position. Spheno cath was attached to syringe containing 5cc of 2% lidocaine, was dipped into lubricating jelly and introduced into the right nostril, through the right internal meatus, into the right lateral middle nasal  cavity. 2.5cc of lidocaine was injected once proper position was obtained. Patient tolerated well. After injection noted bilateral conjunctival injection, tearing of bilateral eyes and increase in temperature to 96 degrees on the right.  Procedure repeated on the left, with similar results, including tearing and bilateral conjunctival injection, temperature increased to 95 degrees.  Post injections, patient remained in supine position for 10 minutes. No complications noted. Patient did note discomfort during the procedure but otherwise tolerated it well.   History   Social History  . Marital Status: Married    Spouse Name: [name]    Number of Children: 2  . Years of Education: 12   Occupational History  .  [employer]   Social History Main Topics  . Smoking status: Current Every Day Smoker -- 10 years    Types: Cigarettes  . Smokeless tobacco: Never Used  . Alcohol Use: 0.6 oz/week    1 Shots of liquor per week     Comment: Rare  . Drug Use: No  . Sexual Activity: Not on file   Other Topics Concern  . Not on file   Social History Narrative   Patient lives at home with his wife [name]). Patient works full time.    High school education   Right handed.   Caffeine.One soda daily    Family History  Problem Relation Age of Onset  . Diabetes Mother   . Diabetes Father   . High blood pressure Father     Past Medical History  Diagnosis Date  . Headache   . Diabetes mellitus without complication     Past Surgical History  Procedure Laterality Date  . Ankle surgery Left   . Kidney stones  Current Outpatient Prescriptions  Medication Sig Dispense Refill  . butalbital-acetaminophen-caffeine (FIORICET) 50-325-40 MG per tablet Take 1 tablet by mouth every 6 (six) hours as needed for headache.  15 tablet  0  . latanoprost (XALATAN) 0.005 % ophthalmic solution Place 1 drop into both eyes at bedtime.      . metFORMIN (GLUCOPHAGE) 500 MG tablet Take 500 mg by mouth 2  (two) times daily with a meal.      . rizatriptan (MAXALT-MLT) 5 MG disintegrating tablet Take 1 tablet (5 mg total) by mouth as needed for migraine. May repeat in 2 hours if needed  15 tablet  12  . SUMAtriptan (IMITREX) 100 MG tablet Take 1 tablet (100 mg total) by mouth every 4 (four) hours as needed for migraine.  10 tablet  0  . Topiramate ER 100 MG CP24 Take 100 mg by mouth at bedtime.  30 capsule  12   Current Facility-Administered Medications  Medication Dose Route Frequency Provider Last Rate Last Dose  . lidocaine (XYLOCAINE) 2 % (with pres) injection 100 mg  5 mL Intradermal Once Omelia Blackwater, DO        Allergies as of 01/08/2013  . (No Known Allergies)    Vitals: BP 153/101  Pulse 62  Ht 5\' 10"  (1.778 m)  Wt 190 lb (86.183 kg)  BMI 27.26 kg/m2 Last Weight:  Wt Readings from Last 1 Encounters:  01/08/13 190 lb (86.183 kg)   Last Height:   Ht Readings from Last 1 Encounters:  01/08/13 5\' 10"  (1.778 m)     Assessment and Plan:  1)Cluster headache  Gerald Moody is a 37 y.o. male here for SPG injections for cluster headache.  Procedure performed as above. Per results noted temperature increase of 3 on the right and 4 degrees on the left,  with bilateral tearing and conjunctival injections. Tolerated procedure well. No adverse effects noted. Will continue to follow.

## 2013-01-24 ENCOUNTER — Emergency Department (HOSPITAL_COMMUNITY)
Admission: EM | Admit: 2013-01-24 | Discharge: 2013-01-25 | Disposition: A | Discharge status: Home / Self Care | Payer: Managed Care, Other (non HMO) | Attending: Emergency Medicine | Admitting: Emergency Medicine

## 2013-01-24 ENCOUNTER — Encounter (HOSPITAL_COMMUNITY): Payer: Self-pay | Admitting: Emergency Medicine

## 2013-01-24 ENCOUNTER — Emergency Department (HOSPITAL_COMMUNITY): Payer: Managed Care, Other (non HMO)

## 2013-01-24 DIAGNOSIS — Z9119 Patient's noncompliance with other medical treatment and regimen: Secondary | ICD-10-CM | POA: Insufficient documentation

## 2013-01-24 DIAGNOSIS — Z8679 Personal history of other diseases of the circulatory system: Secondary | ICD-10-CM | POA: Insufficient documentation

## 2013-01-24 DIAGNOSIS — Z79899 Other long term (current) drug therapy: Secondary | ICD-10-CM | POA: Insufficient documentation

## 2013-01-24 DIAGNOSIS — R112 Nausea with vomiting, unspecified: Secondary | ICD-10-CM | POA: Insufficient documentation

## 2013-01-24 DIAGNOSIS — F17200 Nicotine dependence, unspecified, uncomplicated: Secondary | ICD-10-CM | POA: Insufficient documentation

## 2013-01-24 DIAGNOSIS — Z91199 Patient's noncompliance with other medical treatment and regimen due to unspecified reason: Secondary | ICD-10-CM | POA: Insufficient documentation

## 2013-01-24 DIAGNOSIS — G43909 Migraine, unspecified, not intractable, without status migrainosus: Secondary | ICD-10-CM

## 2013-01-24 DIAGNOSIS — H539 Unspecified visual disturbance: Secondary | ICD-10-CM | POA: Insufficient documentation

## 2013-01-24 DIAGNOSIS — I10 Essential (primary) hypertension: Secondary | ICD-10-CM

## 2013-01-24 DIAGNOSIS — E119 Type 2 diabetes mellitus without complications: Secondary | ICD-10-CM | POA: Insufficient documentation

## 2013-01-24 HISTORY — DX: Essential (primary) hypertension: I10

## 2013-01-24 MED ORDER — SODIUM CHLORIDE 0.9 % IV BOLUS (SEPSIS)
1000.0000 mL | Freq: Once | INTRAVENOUS | Status: AC
  Administered 2013-01-24: 1000 mL via INTRAVENOUS

## 2013-01-24 MED ORDER — METOCLOPRAMIDE HCL 5 MG/ML IJ SOLN
10.0000 mg | Freq: Once | INTRAMUSCULAR | Status: AC
  Administered 2013-01-24: 10 mg via INTRAVENOUS
  Filled 2013-01-24: qty 2

## 2013-01-24 MED ORDER — DIPHENHYDRAMINE HCL 50 MG/ML IJ SOLN
25.0000 mg | Freq: Once | INTRAMUSCULAR | Status: AC
  Administered 2013-01-24: 25 mg via INTRAVENOUS
  Filled 2013-01-24: qty 1

## 2013-01-24 NOTE — ED Notes (Signed)
Pt returned from CT °

## 2013-01-24 NOTE — ED Provider Notes (Signed)
CSN: [id number]     Arrival date & time 01/24/13  1903 History   First MD Initiated Contact with Patient 01/24/13 2027     Chief Complaint  Patient presents with  . Headache   (Consider location/radiation/quality/duration/timing/severity/associated sxs/prior Treatment) The history is provided by the patient. No language interpreter was used.  Anand Sheley is a 37 year old male with past medical history of headaches, diabetes, hypertension presenting to emergency department with headache that started at approximately 5:00 PM today. Patient reports is gone progressively worse over the course of the day. Patient reports that the sensation is localized left side of his head, described as a constant throbbing sensation radiating pain 8/10. Patient reports that the discomfort radiates down his left jaw and left side of the neck. Patient reports she's been feeling nauseous-reported 5 episodes of emesis-nonbilious and nonbloody. Patient reports he does have a high blood pressure has not been taking his medications for many months, reported that he does not feel like taking his medications. Patient reports that he's been using Benadryl, reported that he vomited Benadryl today. Patient reported that when he gets his headaches this is how he presents in this manner, patient reported normal symptoms of headache. Denied chest pain, shortness of breath, difficulty breathing, fever, chills, diarrhea, weakness, numbness, tingling. PCP none  Past Medical History  Diagnosis Date  . Headache   . Diabetes mellitus without complication   . Hypertension    Past Surgical History  Procedure Laterality Date  . Ankle surgery Left   . Kidney stones     Family History  Problem Relation Age of Onset  . Diabetes Mother   . Diabetes Father   . High blood pressure Father    History  Substance Use Topics  . Smoking status: Current Every Day Smoker -- 10 years    Types: Cigarettes  . Smokeless tobacco: Never Used   . Alcohol Use: 0.6 oz/week    1 Shots of liquor per week     Comment: Rare    Review of Systems  Constitutional: Negative for fever and chills.  Eyes: Positive for visual disturbance (left eye).  Respiratory: Negative for chest tightness and shortness of breath.   Cardiovascular: Negative for chest pain.  Gastrointestinal: Positive for nausea and vomiting. Negative for abdominal pain.  Musculoskeletal: Negative for back pain.  Neurological: Positive for headaches. Negative for dizziness and weakness.  All other systems reviewed and are negative.    Allergies  Review of patient's allergies indicates no known allergies.  Home Medications   Current Outpatient Rx  Name  Route  Sig  Dispense  Refill  . metFORMIN (GLUCOPHAGE) 500 MG tablet   Oral   Take 500 mg by mouth 2 (two) times daily with a meal.         . lisinopril (PRINIVIL,ZESTRIL) 20 MG tablet   Oral   Take 0.5 tablets (10 mg total) by mouth daily.   15 tablet   0   . ondansetron (ZOFRAN) 4 MG tablet   Oral   Take 1 tablet (4 mg total) by mouth every 6 (six) hours.   12 tablet   0   . SUMAtriptan (IMITREX) 100 MG tablet   Oral   Take 1 tablet (100 mg total) by mouth every 4 (four) hours as needed for migraine.   10 tablet   0    BP 160/91  Pulse 54  Temp(Src) 98 F (36.7 C) (Oral)  Resp 15  SpO2 99% Physical Exam  Nursing  note and vitals reviewed. Constitutional: He is oriented to person, place, and time. He appears well-developed and well-nourished. No distress.  HENT:  Head: Normocephalic and atraumatic.  Mouth/Throat: Oropharynx is clear and moist. No oropharyngeal exudate.  Negative nystagmus  Eyes: Conjunctivae and EOM are normal. Pupils are equal, round, and reactive to light. Right eye exhibits no discharge. Left eye exhibits no discharge.  Neck: Normal range of motion. Neck supple.  Negative neck stiffness Negative nuchal rigidity Negative cervical lymphadenopathy Negative meningeal  signs  Cardiovascular: Normal rate, regular rhythm and normal heart sounds.  Exam reveals no friction rub.   No murmur heard. Pulses:      Radial pulses are 2+ on the right side, and 2+ on the left side.       Dorsalis pedis pulses are 2+ on the right side, and 2+ on the left side.  Musculoskeletal: Normal range of motion.  Lymphadenopathy:    He has no cervical adenopathy.  Neurological: He is alert and oriented to person, place, and time. No cranial nerve deficit. He exhibits normal muscle tone. Coordination normal.  Cranial nerves II through XII grossly intact Strength 5+/5+ upper and lower extremities bilaterally with resistance, equal distribution identified Alert and oriented Follows commands Responds to questions appropriately  Skin: Skin is warm and dry. No rash noted. He is not diaphoretic. No erythema.  Psychiatric: He has a normal mood and affect. His behavior is normal. Thought content normal.    ED Course  Procedures (including critical care time)  This provider reviewed patient's chart-patient has history of headaches/migraines-patient has poor medical compliance in regards to controlling his hypertension.  Labs Review Labs Reviewed  GLUCOSE, CAPILLARY - Abnormal; Notable for the following:    Glucose-Capillary 277 (*)    All other components within normal limits   Imaging Review Ct Head Wo Contrast  01/24/2013   CLINICAL DATA:  Headache  EXAM: CT HEAD WITHOUT CONTRAST  TECHNIQUE: Contiguous axial images were obtained from the base of the skull through the vertex without intravenous contrast.  COMPARISON:  None  Correlation: MRI brain 01/14/2009  FINDINGS: Normal ventricular morphology.  No midline shift or mass effect.  Normal appearance of brain parenchyma.  No intracranial hemorrhage, mass lesion, or acute infarction.  Visualized paranasal sinuses and mastoid air cells clear.  Bones unremarkable.  IMPRESSION: No acute intracranial abnormalities.   Electronically  Signed   By: Ulyses Southward M.D.   On: 01/24/2013 21:34    EKG Interpretation   None       MDM   1. Migraine   2. HTN (hypertension)     Patient presenting to the ED with headache that has been ongoing starting today at approximately 5:00PM. Reported that the pain is a constant throbbing sensation localized to the left side of the face. Patient reported that he has not been taking his medication for his blood pressure - poor medical compliance.  Alert and oriented. Negative neurological deficits noted. Cranial nerves grossly intact. Full ROM to upper and lower extremities. Heart rate and rhythm normal. Lungs clear to auscultation bilaterally. PERRLA.  CT head negative for acute intracranial abnormalities.  Suspicion to be cluster headache - typical presentation of patient's headaches. Poor control of HTN. Patient stable, afebrile. Pain controlled in ED setting. Patient able to tolerate fluids PO. Discharged patient. Discharged patient with zofran and Lisinopril for control of blood pressure. Referred [facility] to [facility] and neurology. Discussed with patient continue to rest and stay hydrated. Discussed  with patient to closely monitor symptoms and if symptoms are to worsen or change to report back to emergency department  - strict return instructions given. Patient agreed to plan of care, understood, all questions answered.    Raymon Mutton, PA-C 01/25/13 2242

## 2013-01-24 NOTE — ED Notes (Signed)
Pt c/o light and sound sensitivity, blurry vision to left eye, and nausea/vomiting. Pt states pain is in front of head. Pt states he took benedryl for pain. Rates pain 8/10. Alert and oriented x4.

## 2013-01-24 NOTE — ED Notes (Addendum)
Pt has had a migraine headache on and off x 2wks. Pt went to neurologist had a procedure done, did not find the cause of migraines, and was given medications. Pt ran out of medication. Pt states he took benedryl for his pain. Pt also reports some diaphoresis and dizziness. Pt has hx of HTN, DM, and migraines and is non compliant with medication. Pt has 20g RH. Was given 4mg  zofran en route.

## 2013-01-25 MED ORDER — LISINOPRIL 20 MG PO TABS: 10.0000 mg | ORAL_TABLET | Freq: Every day | ORAL | Status: DC

## 2013-01-25 MED ORDER — ONDANSETRON HCL 4 MG PO TABS: 4.0000 mg | ORAL_TABLET | Freq: Four times a day (QID) | ORAL | Status: DC

## 2013-01-25 NOTE — ED Notes (Signed)
Pt. Alert and oriented x4. Ambulatory. Stable for discharge.

## 2013-01-26 NOTE — ED Provider Notes (Signed)
Medical screening examination/treatment/procedure(s) were performed by non-physician practitioner and as supervising physician I was immediately available for consultation/collaboration.  EKG Interpretation   None         Shelda Jakes, MD 01/26/13 [phone]

## 2013-01-27 ENCOUNTER — Telehealth: Payer: Self-pay

## 2013-01-27 ENCOUNTER — Other Ambulatory Visit: Payer: Self-pay

## 2013-01-27 MED ORDER — TOPIRAMATE 100 MG PO TABS: 50.0000 mg | ORAL_TABLET | Freq: Two times a day (BID) | ORAL | Status: DC

## 2013-01-27 MED ORDER — TOPIRAMATE ER 100 MG PO CAP24: 100.0000 mg | ORAL_CAPSULE | Freq: Every day | ORAL | Status: DC

## 2013-01-27 NOTE — Telephone Encounter (Signed)
I have returned call to patient x 2 and he has attempted to contact me x 2. I have asked that he leave a VM with details about how I can help him so that I can move forward with meeting his medical needs. Please call back.

## 2013-01-27 NOTE — Telephone Encounter (Signed)
Pt given coupon for Trokendi XR. Order not entered. I will do that now.

## 2013-01-27 NOTE — Telephone Encounter (Signed)
Pharmacy called. The do not have Topiramate 100 mg ER. They tried calling four other pharmacies to no avail. They are asking if patient can be ordered topiramate 100 mg (non-ER) more frequently through the day.  Please advise. Patient waiting at pharmacy.

## 2013-01-27 NOTE — Telephone Encounter (Signed)
I consulted with Dr. Terrace Arabia. She has instructed that patient should have 100 mg divided into 50 mg twice a day. I called [facility] pharmacy and they will review with patient. I will update order in chart.

## 2013-01-29 ENCOUNTER — Telehealth: Payer: Self-pay | Admitting: Neurology

## 2013-01-29 NOTE — Telephone Encounter (Signed)
I fail to reach patient, Gerald Moody, please give him a follow up visit at this week.

## 2013-01-29 NOTE — Telephone Encounter (Signed)
Patient said that headaches worse than ever yesterday, and he was vomiting with them,topirmate medicine is not helping.

## 2013-01-30 NOTE — Telephone Encounter (Signed)
Patient is coming to see Dr.Yan 01-31-2013

## 2013-01-31 ENCOUNTER — Encounter: Payer: Self-pay | Admitting: Neurology

## 2013-01-31 ENCOUNTER — Ambulatory Visit (INDEPENDENT_AMBULATORY_CARE_PROVIDER_SITE_OTHER): Payer: Managed Care, Other (non HMO) | Admitting: Neurology

## 2013-01-31 ENCOUNTER — Encounter (INDEPENDENT_AMBULATORY_CARE_PROVIDER_SITE_OTHER): Payer: Self-pay

## 2013-01-31 VITALS — BP 131/84 | HR 81 | Ht 70.0 in | Wt 186.0 lb

## 2013-01-31 DIAGNOSIS — G44009 Cluster headache syndrome, unspecified, not intractable: Secondary | ICD-10-CM

## 2013-01-31 MED ORDER — DIHYDROERGOTAMINE MESYLATE 4 MG/ML NA SOLN: 1.0000 | NASAL | Status: DC | PRN

## 2013-01-31 MED ORDER — DIVALPROEX SODIUM ER 500 MG PO TB24: 500.0000 mg | ORAL_TABLET | Freq: Every day | ORAL | Status: DC

## 2013-01-31 MED ORDER — VERAPAMIL HCL 80 MG PO TABS: 80.0000 mg | ORAL_TABLET | Freq: Two times a day (BID) | ORAL | Status: DC

## 2013-01-31 NOTE — Progress Notes (Signed)
[facility]  PATIENT: Gerald Moody DOB: 10/14/1975  HISTORICAL  Gerald Moody is a 37 years old right-handed African American male, referred by emergency room for evaluation of headaches  He had past medical history of hypertension, diabetes, presenting with left-sided headaches since September 2014, he complains of left frontal area severe pounding headache, with associated left-sided tearing, running nose, radiating pain to his left parietal region, left shoulder, left arm. He also complains of nausea vomitting during the episodes    He has 2-3 episodes of headache each day, it is usually happened late night, or early morning times, each episode lasted about 20-30 minutes, oxygen helps, Imitrex helps too. He tends to pace around during his headaches  He had similar episode in 2012, which lasted about 2 weeks,   In last visit in Oct 15th 2014, he got sphenopalatiganglion block, which has helped his headache for 2 weeks. He now has recurrent severe, frequent left side headaches again, it can happen couple times a day, with associated autonomic phenomena, tearing, running nose.  If he catches headaches early on, oxygen might help. Imitrex helped sometimes, but made him very sleepy CT head without contrast was normal   he could not tolerate Topamax, make him feel weird   REVIEW OF SYSTEMS: Full 14 system review of systems performed and notable only for weight loss, fatigue, blurry vision, feeling hot, change in appetite, confusion, headaches, weakness, sleepiness  ALLERGIES: No Known Allergies  HOME MEDICATIONS: Outpatient Prescriptions Prior to Visit  Medication Sig Dispense Refill  . lisinopril (PRINIVIL,ZESTRIL) 20 MG tablet Take 0.5 tablets (10 mg total) by mouth daily.  15 tablet  0  . metFORMIN (GLUCOPHAGE) 500 MG tablet Take 500 mg by mouth 2 (two) times daily with a meal.      . ondansetron (ZOFRAN) 4 MG tablet Take 1 tablet (4 mg total) by mouth every 6 (six)  hours.  12 tablet  0  . SUMAtriptan (IMITREX) 100 MG tablet Take 1 tablet (100 mg total) by mouth every 4 (four) hours as needed for migraine.  10 tablet  0  . topiramate (TOPAMAX) 100 MG tablet Take 0.5 tablets (50 mg total) by mouth 2 (two) times daily.  120 tablet  3   Facility-Administered Medications Prior to Visit  Medication Dose Route Frequency Provider Last Rate Last Dose  . lidocaine (XYLOCAINE) 2 % (with pres) injection 100 mg  5 mL Intradermal Once Gerald Blackwater, DO        PAST MEDICAL HISTORY: Past Medical History  Diagnosis Date  . Headache   . Diabetes mellitus without complication   . Hypertension     PAST SURGICAL HISTORY: Past Surgical History  Procedure Laterality Date  . Ankle surgery Left   . Kidney stones      FAMILY HISTORY: Family History  Problem Relation Age of Onset  . Diabetes Mother   . Diabetes Father   . High blood pressure Father     SOCIAL HISTORY:  History   Social History  . Marital Status: Married    Spouse Name: Gerald Moody    Number of Children: 2  . Years of Education: 12   Occupational History  .  [employer]   Social History Main Topics  . Smoking status: Current Every Day Smoker -- 10 years    Types: Cigarettes  . Smokeless tobacco: Never Used  . Alcohol Use: 0.6 oz/week    1 Shots of liquor per week  Comment: Rare  . Drug Use: No  . Sexual Activity: Not on file   Other Topics Concern  . Not on file   Social History Narrative   Patient lives at home with his wife Gerald Moody). Patient works full time.    High school education   Right handed.   Caffeine.One soda daily     PHYSICAL EXAM   Filed Vitals:   01/31/13 1037  BP: 131/84  Pulse: 81  Height: 5\' 10"  (1.778 m)  Weight: 186 lb (84.369 kg)   Body mass index is 26.69 kg/(m^2).   Generalized: In no acute distress  Neck: Supple, no carotid bruits   Cardiac: Regular rate rhythm  Pulmonary: Clear to auscultation  bilaterally  Musculoskeletal: No deformity  Neurological examination  Mentation: Alert oriented to time, place, history taking, and causual conversation  Cranial nerve II-XII: Pupils were equal round reactive to light extraocular movements were full, visual field were full on confrontational test. facial sensation and strength were normal. hearing was intact to finger rubbing bilaterally. Uvula tongue midline.  head turning and shoulder shrug and were normal and symmetric.Tongue protrusion into cheek strength was normal.  Motor: normal tone, bulk and strength.  Sensory: Intact to fine touch, pinprick, preserved vibratory sensation, and proprioception at toes.  Coordination: Normal finger to nose, heel-to-shin bilaterally there was no truncal ataxia  Gait: Rising up from seated position without assistance, normal stance, without trunk ataxia, moderate stride, good arm swing, smooth turning, able to perform tiptoe, and heel walking without difficulty.   Romberg signs: Negative  Deep tendon reflexes: Brachioradialis 2/2, biceps 2/2, triceps 2/2, patellar 2/2, Achilles 2/2, plantar responses were flexor bilaterally.   DIAGNOSTIC DATA (LABS, IMAGING, TESTING) - I reviewed patient records, labs, notes, testing and imaging myself where available.  Lab Results  Component Value Date   WBC 8.3 10/18/2008   HGB 16.7 10/18/2008   HCT 49.0 10/18/2008   MCV 84.0 10/18/2008   PLT 215 10/18/2008      Component Value Date/Time   NA 140 10/18/2008 0104   K 4.0 10/18/2008 0104   CL 105 10/18/2008 0104   GLUCOSE 160* 10/18/2008 0104   BUN 15 10/18/2008 0104   CREATININE 1.2 10/18/2008 0104    ASSESSMENT AND PLAN  37 years old right-handed [place] American male, with cluster headache, involving his left side, normal neurological examination.  1. I will try Depakote XR 500 mg as  preventive measure, also add on verapamil 80mg  bid 2. Zomig nasal spray sample was provided. 3. Migrinal nasal spray rx  was called in  4. RTC in 3 months   Gerald Moody, M.D. Ph.D.  [facility] [address] [place], [place] [address] [phone]

## 2013-02-12 ENCOUNTER — Telehealth: Payer: Self-pay | Admitting: Neurology

## 2013-02-13 NOTE — Telephone Encounter (Signed)
I called and left patient a VM. I need to know when he was out of work, is he back, when did he return to work, how often  does he miss work, is it for migraines. I need him to contact his employer and ask that they send me an FMLA form. They can fax it to [phone]. He also has to come in, sign a release of information and make a $20.00 payment for me to process the form.   Please contact me with any other questions.

## 2013-02-13 NOTE — Telephone Encounter (Signed)
I received a return call from patient. He simply needs the last note that had been completed by Loretha Stapler, RN faxed to a different location. We will not charge him twice for this document. I will send to Dr. Terrace Arabia for signature and then fax to the number he requested.

## 2013-02-21 NOTE — Telephone Encounter (Signed)
Letter faxed.

## 2013-02-25 ENCOUNTER — Ambulatory Visit (INDEPENDENT_AMBULATORY_CARE_PROVIDER_SITE_OTHER): Payer: Managed Care, Other (non HMO) | Admitting: Neurology

## 2013-02-25 ENCOUNTER — Encounter: Payer: Self-pay | Admitting: Neurology

## 2013-02-25 ENCOUNTER — Encounter (INDEPENDENT_AMBULATORY_CARE_PROVIDER_SITE_OTHER): Payer: Self-pay

## 2013-02-25 VITALS — BP 137/94 | HR 81 | Ht 70.0 in | Wt 188.0 lb

## 2013-02-25 DIAGNOSIS — G44009 Cluster headache syndrome, unspecified, not intractable: Secondary | ICD-10-CM

## 2013-02-25 MED ORDER — SUMATRIPTAN SUCCINATE 6 MG/0.5ML ~~LOC~~ SOAJ: 6.0000 mg | SUBCUTANEOUS | Status: DC | PRN

## 2013-02-25 MED ORDER — RIZATRIPTAN BENZOATE 10 MG PO TBDP: 10.0000 mg | ORAL_TABLET | ORAL | Status: DC | PRN

## 2013-02-25 MED ORDER — NORTRIPTYLINE HCL 25 MG PO CAPS: ORAL_CAPSULE | ORAL | Status: DC

## 2013-02-25 NOTE — Progress Notes (Signed)
[facility]  PATIENT: Gerald Moody DOB: 1975/11/05  HISTORICAL  Gerald Moody is a 37 years old right-handed African American male, referred by emergency room for evaluation of headaches  He had past medical history of hypertension, diabetes, presenting with left-sided headaches since September 2014, he complains of left frontal area severe pounding headache, with associated left-sided tearing, running nose, radiating pain to his left parietal region, left shoulder, left arm. He also complains of nausea vomitting during those episodes    He has 2-3 episodes of headache each day, it is usually happened late night, or early morning times, each episode lasted about 20-30 minutes, oxygen helps, Imitrex helps too. He tends to pace around during his headaches  He had similar episode in 2012, which lasted about 2 weeks,   In last visit in Oct 15th 2014, he got sphenopalatiganglion block, which has helped his headache for 2 weeks. He now has recurrent severe, frequent left side headaches again, it can happen couple times a day, with associated autonomic phenomena, tearing, running nose.  If he catches headaches early on, oxygen might help. Imitrex helped sometimes, but made him very sleepy CT head without contrast was normal  He could not tolerate Topamax, make him feel weird  UPDATE 02/25/2013:  He still  Use oxygen as abortive treatment, he has three headaches in one week, left frontal headaches, left retroorbital area, poking his left eye, left eye tearing, left nose running, last 20 minutes to 60 minutes, nause, vomitting, he prefer to walk backforth, hurt worse when he lies down, oxygen helps the most, he has tried nasal spray zomig did not help, insurance does not pay for migrinal nasal spray,  Depapkote xr 500mg  qhs helps his headache some.  He reported 20 % improvement,  Now he is having 2-3 times a week. He is also taking verapamil 80mg  bid.  He has been off since Sept  2014, he put powerlines into ground, heavy labor, needs more time off.   REVIEW OF SYSTEMS: Full 14 system review of systems performed and notable only for confusion, headache, weakness, depression, racing thought., fever,chill, weight loss, fatigue, feeling hot, feeling cold.  ALLERGIES: No Known Allergies  HOME MEDICATIONS: Outpatient Prescriptions Prior to Visit  Medication Sig Dispense Refill  . dihydroergotamine (MIGRANAL) 4 MG/ML nasal spray Place 1 spray into the nose as needed for migraine. Use in one nostril as directed.  No more than 4 sprays in one hour  8 mL  12  . divalproex (DEPAKOTE ER) 500 MG 24 hr tablet Take 1 tablet (500 mg total) by mouth daily.  30 tablet  12  . INVOKANA 100 MG TABS 100 mg daily.      Marland Kitchen losartan (COZAAR) 50 MG tablet 50 mg daily.      . metFORMIN (GLUCOPHAGE) 500 MG tablet Take 500 mg by mouth 2 (two) times daily with a meal.      . ondansetron (ZOFRAN) 4 MG tablet Take 1 tablet (4 mg total) by mouth every 6 (six) hours.  12 tablet  0  . oxyCODONE-acetaminophen (PERCOCET/ROXICET) 5-325 MG per tablet Take 325 tablets by mouth daily.      . SUMAtriptan (IMITREX) 100 MG tablet Take 1 tablet (100 mg total) by mouth every 4 (four) hours as needed for migraine.  10 tablet  0  . topiramate (TOPAMAX) 100 MG tablet Take 0.5 tablets (50 mg total) by mouth 2 (two) times daily.  120 tablet  3  . verapamil (CALAN) 80 MG  tablet Take 1 tablet (80 mg total) by mouth 2 (two) times daily.  60 tablet  12  . lisinopril (PRINIVIL,ZESTRIL) 20 MG tablet Take 0.5 tablets (10 mg total) by mouth daily.  15 tablet  0   Facility-Administered Medications Prior to Visit  Medication Dose Route Frequency Provider Last Rate Last Dose  . lidocaine (XYLOCAINE) 2 % (with pres) injection 100 mg  5 mL Intradermal Once Omelia Blackwater, DO        PAST MEDICAL HISTORY: Past Medical History  Diagnosis Date  . Headache   . Diabetes mellitus without complication   . Hypertension      PAST SURGICAL HISTORY: Past Surgical History  Procedure Laterality Date  . Ankle surgery Left   . Kidney stones      FAMILY HISTORY: Family History  Problem Relation Age of Onset  . Diabetes Mother   . Diabetes Father   . High blood pressure Father     SOCIAL HISTORY:  History   Social History  . Marital Status: Married    Spouse Name: [name]    Number of Children: 2  . Years of Education: 12   Occupational History  .  [employer]   Social History Main Topics  . Smoking status: Current Every Day Smoker -- 10 years    Types: Cigarettes  . Smokeless tobacco: Never Used  . Alcohol Use: 0.6 oz/week    1 Shots of liquor per week     Comment: Rare  . Drug Use: No  . Sexual Activity: Not on file   Other Topics Concern  . Not on file   Social History Narrative   Patient lives at home with his wife [name]). Patient works full time.    High school education   Right handed.   Caffeine.One soda daily     PHYSICAL EXAM   Filed Vitals:   02/25/13 0931  BP: 137/94  Pulse: 81  Height: 5\' 10"  (1.778 m)  Weight: 188 lb (85.276 kg)   Body mass index is 26.98 kg/(m^2).   Generalized: In no acute distress  Neck: Supple, no carotid bruits   Cardiac: Regular rate rhythm  Pulmonary: Clear to auscultation bilaterally  Musculoskeletal: No deformity  Neurological examination  Mentation: Alert oriented to time, place, history taking, and causual conversation  Cranial nerve II-XII: Pupils were equal round reactive to light extraocular movements were full, visual field were full on confrontational test. facial sensation and strength were normal. hearing was intact to finger rubbing bilaterally. Uvula tongue midline.  head turning and shoulder shrug and were normal and symmetric.Tongue protrusion into cheek strength was normal.  Motor: normal tone, bulk and strength.  Sensory: Intact to fine touch, pinprick, preserved vibratory sensation, and proprioception  at toes.  Coordination: Normal finger to nose, heel-to-shin bilaterally there was no truncal ataxia  Gait: Rising up from seated position without assistance, normal stance, without trunk ataxia, moderate stride, good arm swing, smooth turning, able to perform tiptoe, and heel walking without difficulty.   Romberg signs: Negative  Deep tendon reflexes: Brachioradialis 2/2, biceps 2/2, triceps 2/2, patellar 2/2, Achilles 2/2, plantar responses were flexor bilaterally.   DIAGNOSTIC DATA (LABS, IMAGING, TESTING) - I reviewed patient records, labs, notes, testing and imaging myself where available.  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  1. He is to continue Depakote XR 500 mg, qhs, verapamil 80mg  bid as preventive medications, I will also add on Nortriptyline 25mg  2 tabs qhs. 2. imitrex Sq injection prn. 3. Cambian prn, and Maxalt prn for abortive treatment. 4. RTC in one month   Levert Feinstein, M.D. Ph.D.  [facility] [address] [place], [place] [address] [phone]

## 2013-02-26 ENCOUNTER — Telehealth: Payer: Self-pay | Admitting: *Deleted

## 2013-02-26 NOTE — Telephone Encounter (Signed)
Called patient and requested more information concerning a letter

## 2013-02-27 NOTE — Telephone Encounter (Signed)
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

## 2013-02-27 NOTE — Telephone Encounter (Signed)
I have called him to clarify the requirement of the letter.  Annabelle Harman, it is ready, please call patient to pick up

## 2013-02-27 NOTE — Telephone Encounter (Signed)
Patient will come in today to pay for his letter.

## 2013-03-03 DIAGNOSIS — Z0289 Encounter for other administrative examinations: Secondary | ICD-10-CM

## 2013-03-07 NOTE — Telephone Encounter (Signed)
Done

## 2013-03-10 ENCOUNTER — Ambulatory Visit: Payer: Self-pay | Admitting: Neurology

## 2013-12-22 IMAGING — CT CT HEAD W/O CM
1 series · 16 of 29 positions shown, 20 images · non-contrast
Comparison: None

Correlation: MRI brain 01/14/2009

CLINICAL DATA: Headache

EXAM:
CT HEAD WITHOUT CONTRAST
TECHNIQUE: Contiguous axial images were obtained from the base of the skull
through the vertex without intravenous contrast.

[Series 2: head 5.0 h30s · axial · 0.39mm/px · z∈[+1438,+1568]mm · 16 of 29 slices shown, 20 images]
[im 2/29  brain]
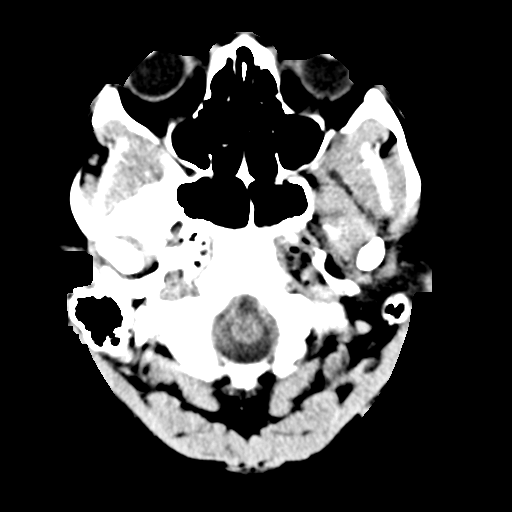
[im 2/29  bone]
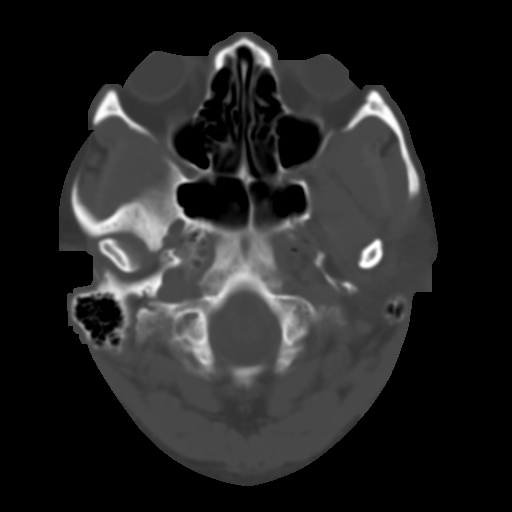
[im 4/29  brain]
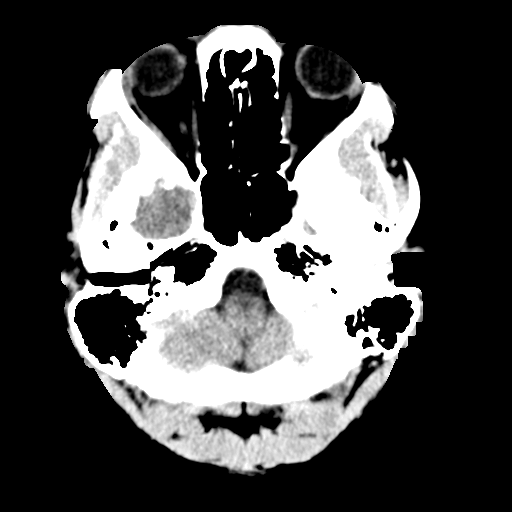
[im 6/29  brain]
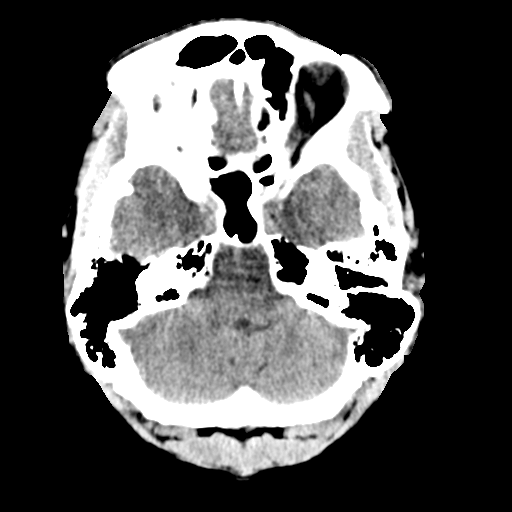
[im 7/29  brain]
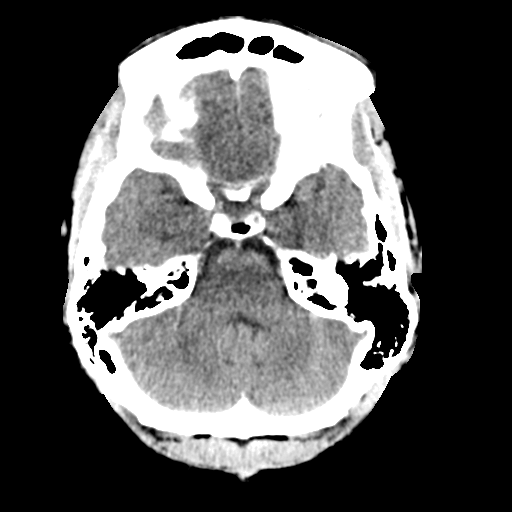
[im 9/29  brain]
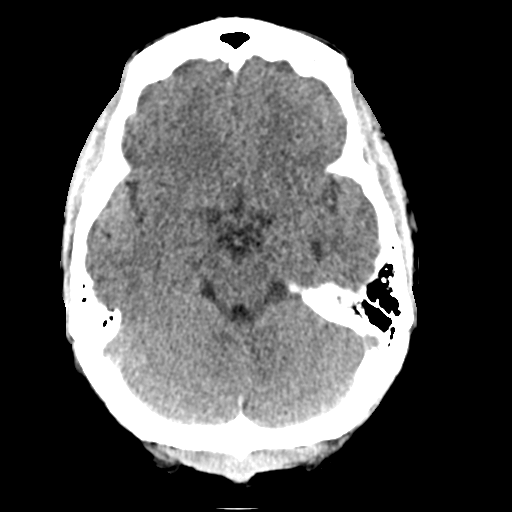
[im 9/29  bone]
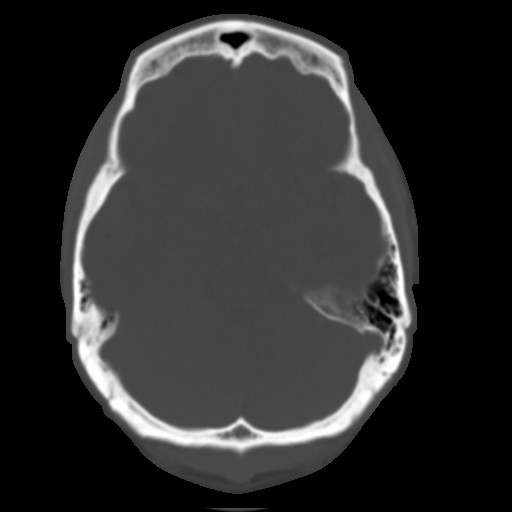
[im 11/29  brain]
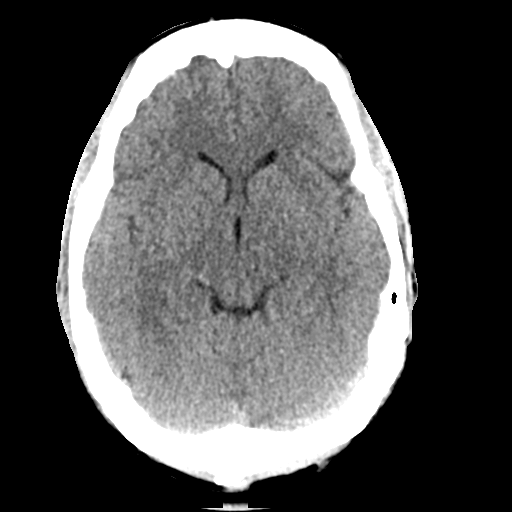
[im 12/29  brain]
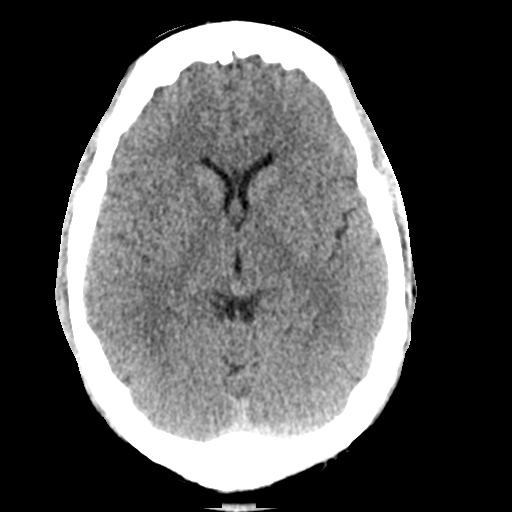
[im 14/29  brain]
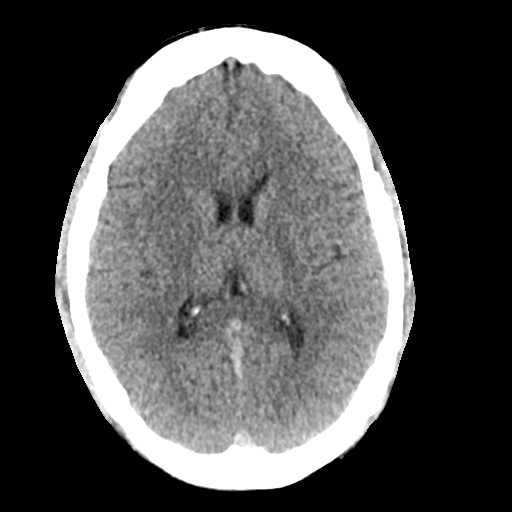
[im 16/29  brain]
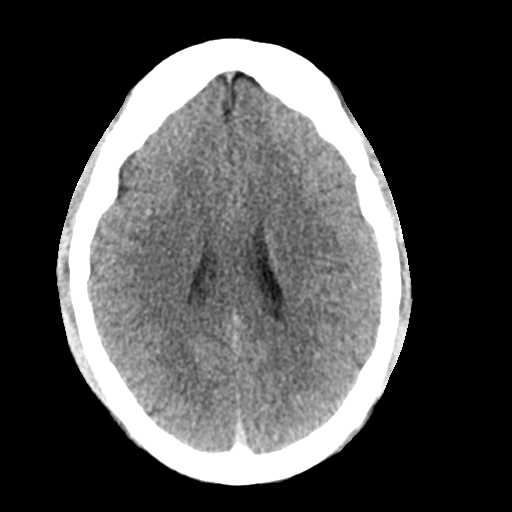
[im 16/29  bone]
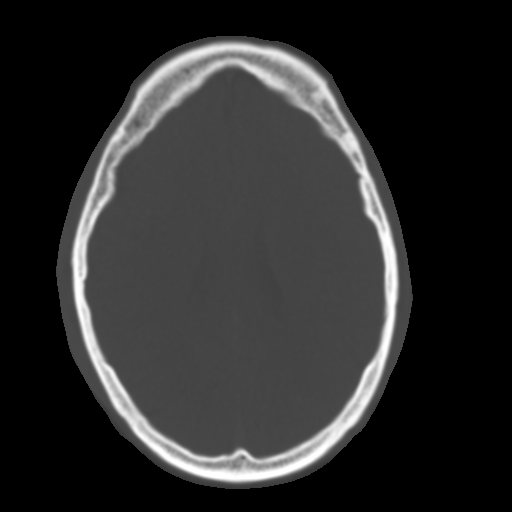
[im 18/29  brain]
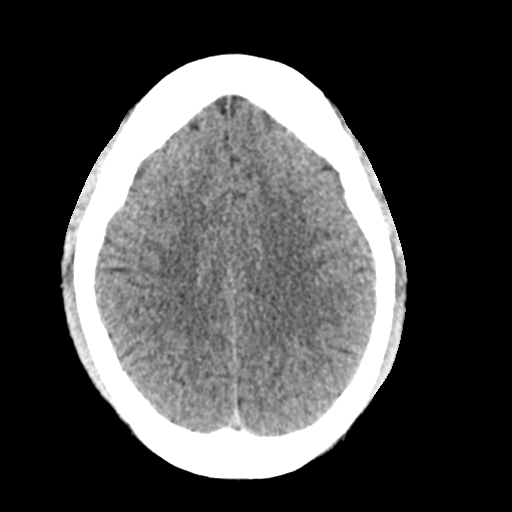
[im 19/29  brain]
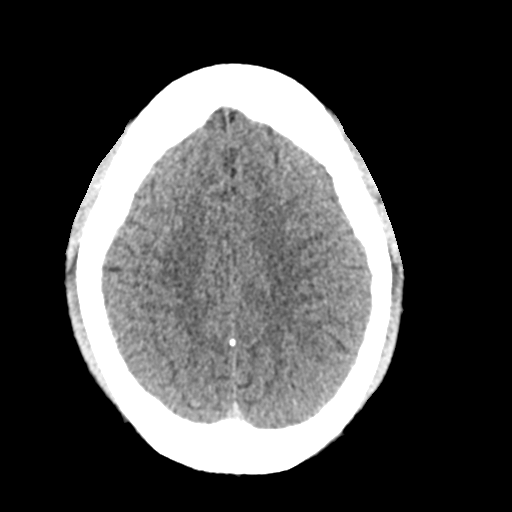
[im 21/29  brain]
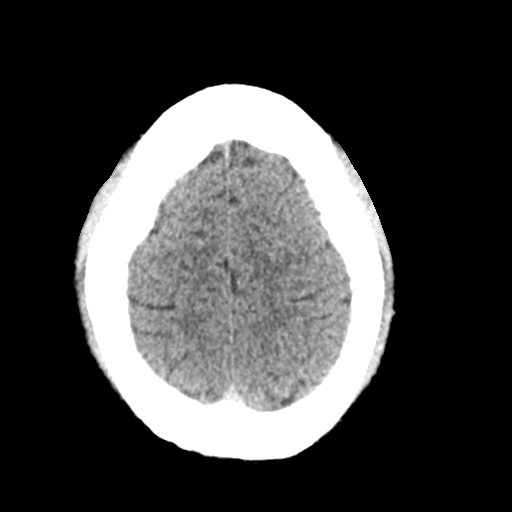
[im 23/29  brain]
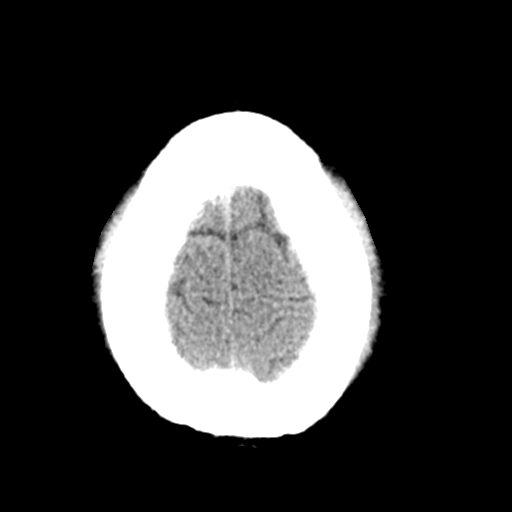
[im 23/29  bone]
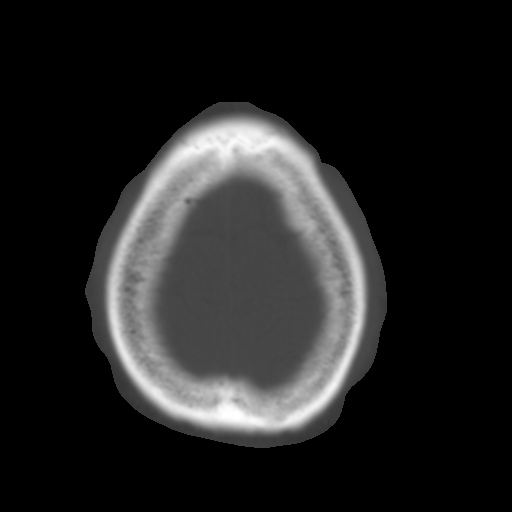
[im 24/29  brain]
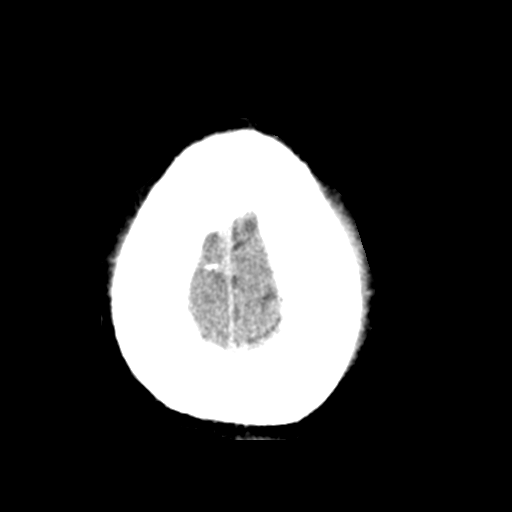
[im 26/29  brain]
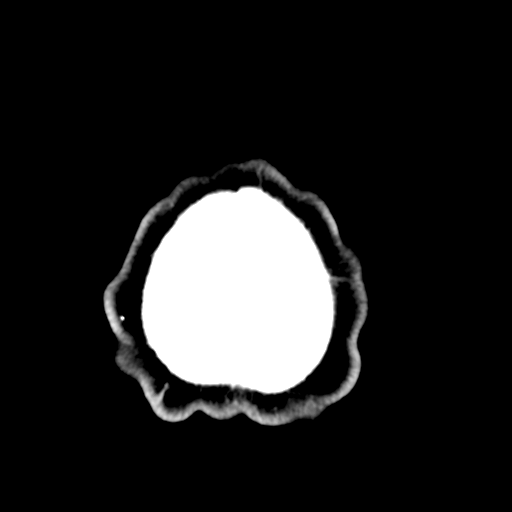
[im 28/29  brain]
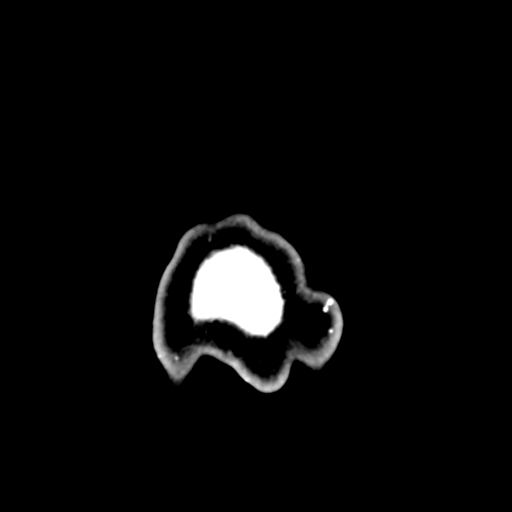

[16 of 29 positions shown; findings below may reference images not displayed]

FINDINGS: Normal ventricular morphology.

No midline shift or mass effect.

Normal appearance of brain parenchyma.

No intracranial hemorrhage, mass lesion, or acute infarction.

Visualized paranasal sinuses and mastoid air cells clear.

Bones unremarkable.
IMPRESSION: No acute intracranial abnormalities.

## 2014-12-28 ENCOUNTER — Encounter: Payer: Self-pay | Admitting: Endocrinology

## 2014-12-28 ENCOUNTER — Ambulatory Visit (INDEPENDENT_AMBULATORY_CARE_PROVIDER_SITE_OTHER): Payer: Managed Care, Other (non HMO) | Admitting: Endocrinology

## 2014-12-28 VITALS — BP 132/92 | HR 77 | Temp 99.0°F | Ht 70.0 in | Wt 191.0 lb

## 2014-12-28 DIAGNOSIS — N182 Chronic kidney disease, stage 2 (mild): Secondary | ICD-10-CM

## 2014-12-28 DIAGNOSIS — E1122 Type 2 diabetes mellitus with diabetic chronic kidney disease: Secondary | ICD-10-CM | POA: Diagnosis not present

## 2014-12-28 DIAGNOSIS — E1129 Type 2 diabetes mellitus with other diabetic kidney complication: Secondary | ICD-10-CM | POA: Insufficient documentation

## 2014-12-28 DIAGNOSIS — I10 Essential (primary) hypertension: Secondary | ICD-10-CM | POA: Diagnosis not present

## 2014-12-28 MED ORDER — PIOGLITAZONE HCL 45 MG PO TABS: 45.0000 mg | ORAL_TABLET | Freq: Every day | ORAL | Status: AC

## 2014-12-28 NOTE — Progress Notes (Signed)
Subjective:    Patient ID: Gerald Moody, male    DOB: 06-29-1975, 39 y.o.   MRN: [id number]  HPI pt states DM was dx'ed in 2002; he has mild neuropathy of the lower extremities, and associated renal insufficiency; he has never been on insulin; pt says his diet and exercise are improved recently; he has never had pancreatitis, severe hypoglycemia or DKA.  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  He seldom checks cbg's.  Metformin was stopped 2 weeks ago, due to renal insufficiency.   Past Medical History  Diagnosis Date  . Headache   . Diabetes mellitus without complication (HCC)   . Hypertension     Past Surgical History  Procedure Laterality Date  . Ankle surgery Left   . Kidney stones      Social History   Social History  . Marital Status: Married    Spouse Name: Gerald Moody  . Number of Children: 2  . Years of Education: 12   Occupational History  .  [employer]   Social History Main Topics  . Smoking status: Current Every Day Smoker -- 10 years    Types: Cigarettes  . Smokeless tobacco: Never Used  . Alcohol Use: 0.6 oz/week    1 Shots of liquor per week     Comment: Rare  . Drug Use: No  . Sexual Activity: Not on file   Other Topics Concern  . Not on file   Social History Narrative   Patient lives at home with his wife Gerald Moody). Patient works full time.    High school education   Right handed.   Caffeine.One soda daily    No current outpatient prescriptions on file prior to visit.   No current facility-administered medications on file prior to visit.    No Known Allergies  Family History  Problem Relation Age of Onset  . Diabetes Mother   . Diabetes Father   . High blood pressure Father     BP 132/92 mmHg  Pulse 77  Temp(Src) 99 F (37.2 C) (Oral)  Ht  (1.778 m)  Wt 191 lb (86.637 kg)  BMI 27.41 kg/m2  SpO2 98%  Review of Systems denies weight loss, blurry vision, headache, chest pain, sob, urinary frequency, muscle cramps,  excessive diaphoresis, depression, cold intolerance, rhinorrhea, and easy bruising.  He has intermittent nausea.      Objective:   Physical Exam VS: see vs page GEN: no distress HEAD: head: no deformity eyes: no periorbital swelling, no proptosis external nose and ears are normal mouth: no lesion seen NECK: supple, thyroid is not enlarged CHEST WALL: no deformity LUNGS: clear to auscultation BREASTS:  No gynecomastia CV: reg rate and rhythm, no murmur ABD: abdomen is soft, nontender.  no hepatosplenomegaly.  not distended.  no hernia MUSCULOSKELETAL: muscle bulk and strength are grossly normal.  no obvious joint swelling.  gait is normal and steady EXTEMITIES: no deformity.  no ulcer on the feet.  feet are of normal color and temp.  no edema.  There is bilateral onychomycosis of the toenails.  PULSES: dorsalis pedis intact bilat.  no carotid bruit NEURO:  cn 2-12 grossly intact.   readily moves all 4's.  sensation is intact to touch on the feet SKIN:  Normal texture and temperature.  No rash or suspicious lesion is visible.   NODES:  None palpable at the neck PSYCH: alert, well-oriented.  Does not appear anxious nor depressed.   outside test results  are reviewed: Creat=1.8 A1c=9.5%  I have reviewed outside records, and summarized: Pt was noted to have persistently elevated a1c, and ref here.  Metformin was d/c'ed, due to elev creat.     Assessment & Plan:  DM: she needs increased rx.  Renal insufficiency: this limits oral rx options.   Occupational status: he wants to control DM without insulin, if possible.     Patient is advised the following: Patient Instructions  good diet and exercise significantly improve the control of your diabetes.  please let me know if you wish to be referred to a dietician.  high blood sugar is very risky to your health.  you should see an eye doctor and dentist every year.  It is very important to get all recommended vaccinations.  controlling  your blood pressure and cholesterol drastically reduces the damage diabetes does to your body.  Those who smoke should quit.  please discuss these with your doctor.  check your blood sugar once a day.  vary the time of day when you check, between before the 3 meals, and at bedtime.  also check if you have symptoms of your blood sugar being too high or too low.  please keep a record of the readings and bring it to your next appointment here.  You can write it on any piece of paper.  please call us sooner if your blood sugar goes below 70, or if you have a lot of readings over 200. i have sent a prescription to your pharmacy, to add "pioglitizone."  Please note that this takes several months to work.  Please see Bonita [name] to start taking "trulicity."  i have sent a prescription to your pharmacy, for this, too.   Please come back for a follow-up appointment in 1 month.

## 2014-12-28 NOTE — Patient Instructions (Addendum)
good diet and exercise significantly improve the control of your diabetes.  please let me know if you wish to be referred to a dietician.  high blood sugar is very risky to your health.  you should see an eye doctor and dentist every year.  It is very important to get all recommended vaccinations.  controlling your blood pressure and cholesterol drastically reduces the damage diabetes does to your body.  Those who smoke should quit.  please discuss these with your doctor.  check your blood sugar once a day.  vary the time of day when you check, between before the 3 meals, and at bedtime.  also check if you have symptoms of your blood sugar being too high or too low.  please keep a record of the readings and bring it to your next appointment here.  You can write it on any piece of paper.  please call us sooner if your blood sugar goes below 70, or if you have a lot of readings over 200. i have sent a prescription to your pharmacy, to add "pioglitizone."  Please note that this takes several months to work.  Please see Bonita [name] to start taking "trulicity."  i have sent a prescription to your pharmacy, for this, too.   Please come back for a follow-up appointment in 1 month.

## 2015-01-04 ENCOUNTER — Encounter: Payer: Managed Care, Other (non HMO) | Attending: Endocrinology | Admitting: Nutrition

## 2015-01-04 ENCOUNTER — Telehealth: Payer: Self-pay

## 2015-01-04 DIAGNOSIS — E1121 Type 2 diabetes mellitus with diabetic nephropathy: Secondary | ICD-10-CM | POA: Diagnosis present

## 2015-01-04 DIAGNOSIS — Z713 Dietary counseling and surveillance: Secondary | ICD-10-CM | POA: Diagnosis not present

## 2015-01-04 MED ORDER — DULAGLUTIDE 0.75 MG/0.5ML ~~LOC~~ SOAJ: 0.7500 mg | SUBCUTANEOUS | Status: AC

## 2015-01-04 NOTE — Telephone Encounter (Signed)
Ok, i have sent a prescription to your pharmacy. If the blood sugar does not improve to the low-100's, please double it.  There is a pen twice as strong, but you can use up what you have by taking 2 shots per week i'll see you next time.

## 2015-01-04 NOTE — Telephone Encounter (Signed)
Patient came today to talk with Bonita [name] about starting trulicity. Patient would like for this medication to be sent to his pharmacy.

## 2015-01-04 NOTE — Progress Notes (Signed)
This patient was instructed on the use of Trulicity. We discussed how the medication works to lower his blood sugar, as well as how the Actos works, and how his glipizide  works.   He is exercising for 20 min. 3-4 times/wk.  He was encouraged to increase this to 30 min., and to do it 4-5 days/wk.   We also discussed diet, and balancing the meals, and eating less fat to reduce his weight.  He reported good understanding of this and had no final questions.  His goal is to get down to 180 pounds in 3-4 weeks. He was encouraged to test his blood sugars ac and 2hr. pc one meal every other day--alternating the meal each time.  He agreed to do this.  He had no final questions.

## 2015-01-04 NOTE — Telephone Encounter (Signed)
Left voicemail advising of note below. Requested call back if the pt would like discuss.

## 2015-01-04 NOTE — Patient Instructions (Signed)
Exercise for 30-40 minutes 5 days/wk. Take Trulicity once a week. Drink no sweet tea, sodas, fruit juices and eat no cold cereal and milk

## 2015-01-25 ENCOUNTER — Ambulatory Visit: Payer: Managed Care, Other (non HMO) | Admitting: Nutrition

## 2015-01-29 ENCOUNTER — Ambulatory Visit (INDEPENDENT_AMBULATORY_CARE_PROVIDER_SITE_OTHER): Payer: Managed Care, Other (non HMO) | Admitting: Endocrinology

## 2015-01-29 VITALS — BP 128/84 | HR 72 | Temp 98.4°F | Ht 70.0 in | Wt 194.0 lb

## 2015-01-29 DIAGNOSIS — E1121 Type 2 diabetes mellitus with diabetic nephropathy: Secondary | ICD-10-CM | POA: Diagnosis not present

## 2015-01-29 LAB — GLUCOSE, POCT (MANUAL RESULT ENTRY): POC Glucose: 93 mg/dl (ref 70–99)

## 2015-01-29 LAB — POCT GLYCOSYLATED HEMOGLOBIN (HGB A1C): Hemoglobin A1C: 8.4

## 2015-01-29 MED ORDER — GLUCOSE BLOOD VI STRP: 1.0000 | ORAL_STRIP | Freq: Every day | Status: AC

## 2015-01-29 MED ORDER — GLIPIZIDE ER 2.5 MG PO TB24: 2.5000 mg | ORAL_TABLET | Freq: Every day | ORAL | Status: DC

## 2015-01-29 NOTE — Progress Notes (Signed)
Subjective:    Patient ID: Gerald Moody, male    DOB: 1975/10/29, 39 y.o.   MRN: [id number]  HPI Pt returns for f/u of diabetes mellitus: DM type: 2 Dx'ed: 2002 Complications: polyneuropathy and renal insufficiency Therapy: trulicity and 2 oral meds. 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  He needs a new meter.  pt states he feels well in general.   Past Medical History  Diagnosis Date  . Headache   . Diabetes mellitus without complication (HCC)   . Hypertension     Past Surgical History  Procedure Laterality Date  . Ankle surgery Left   . Kidney stones      Social History   Social History  . Marital Status: Married    Spouse Name: [name]  . Number of Children: 2  . Years of Education: 12   Occupational History  .  [employer]   Social History Main Topics  . Smoking status: Current Every Day Smoker -- 10 years    Types: Cigarettes  . Smokeless tobacco: Never Used  . Alcohol Use: 0.6 oz/week    1 Shots of liquor per week     Comment: Rare  . Drug Use: No  . Sexual Activity: Not on file   Other Topics Concern  . Not on file   Social History Narrative   Patient lives at home with his wife [name]). Patient works full time.    High school education   Right handed.   Caffeine.One soda daily    Current Outpatient Prescriptions on File Prior to Visit  Medication Sig Dispense Refill  . Dulaglutide (TRULICITY) 0.75 MG/0.5ML SOPN Inject 0.75 mg into the skin once a week. 4 pen 11  . pioglitazone (ACTOS) 45 MG tablet Take 1 tablet (45 mg total) by mouth daily. 30 tablet 11   No current facility-administered medications on file prior to visit.    No Known Allergies  Family History  Problem Relation Age of Onset  . Diabetes Mother   . Diabetes Father   . High blood  pressure Father     BP 128/84 mmHg  Pulse 72  Temp(Src) 98.4 F (36.9 C) (Oral)  Ht  (1.778 m)  Wt 194 lb (87.998 kg)  BMI 27.84 kg/m2  SpO2 94%  Review of Systems He denies hypoglycemia.      Objective:   Physical Exam VITAL SIGNS:  See vs page.   GENERAL: no distress. SKIN:  injection sites at the anterior abdomen are normal.    A1c=8.4%.    Assessment & Plan:  DM: glycemic control is improved.  The next step is to phase out the glipizide, if possible.  Patient is advised the following: Patient Instructions  check your blood sugar once a day.  vary the time of day when you check, between before the 3 meals, and at bedtime.  also check if you have symptoms of your blood sugar being too high or too low.  please keep a record of the readings and bring it to your next appointment here.  You can write it on any piece of paper.  please call us sooner if your blood sugar goes below 70, or if you have a lot of readings over 200.   Here are 2 identical meters.  i have sent a prescription  to your pharmacy, for strips.   Please reduce the glipizide.  i have sent a prescription to your pharmacy.  Please come back for a follow-up appointment in 1 month.

## 2015-01-29 NOTE — Patient Instructions (Addendum)
check your blood sugar once a day.  vary the time of day when you check, between before the 3 meals, and at bedtime.  also check if you have symptoms of your blood sugar being too high or too low.  please keep a record of the readings and bring it to your next appointment here.  You can write it on any piece of paper.  please call us sooner if your blood sugar goes below 70, or if you have a lot of readings over 200.   Here are 2 identical meters.  i have sent a prescription to your pharmacy, for strips.   Please reduce the glipizide.  i have sent a prescription to your pharmacy.  Please come back for a follow-up appointment in 1 month.

## 2015-03-01 ENCOUNTER — Ambulatory Visit: Payer: Managed Care, Other (non HMO) | Admitting: Endocrinology

## 2015-03-07 ENCOUNTER — Emergency Department (HOSPITAL_COMMUNITY)
Admission: EM | Admit: 2015-03-07 | Discharge: 2015-03-07 | Disposition: A | Discharge status: Home / Self Care | Payer: Managed Care, Other (non HMO) | Attending: Emergency Medicine | Admitting: Emergency Medicine

## 2015-03-07 ENCOUNTER — Encounter (HOSPITAL_COMMUNITY): Payer: Self-pay

## 2015-03-07 DIAGNOSIS — R319 Hematuria, unspecified: Secondary | ICD-10-CM

## 2015-03-07 DIAGNOSIS — I10 Essential (primary) hypertension: Secondary | ICD-10-CM | POA: Insufficient documentation

## 2015-03-07 DIAGNOSIS — F17200 Nicotine dependence, unspecified, uncomplicated: Secondary | ICD-10-CM | POA: Insufficient documentation

## 2015-03-07 DIAGNOSIS — E119 Type 2 diabetes mellitus without complications: Secondary | ICD-10-CM | POA: Diagnosis not present

## 2015-03-07 DIAGNOSIS — N179 Acute kidney failure, unspecified: Secondary | ICD-10-CM | POA: Insufficient documentation

## 2015-03-07 DIAGNOSIS — Z7984 Long term (current) use of oral hypoglycemic drugs: Secondary | ICD-10-CM | POA: Diagnosis not present

## 2015-03-07 DIAGNOSIS — Z87442 Personal history of urinary calculi: Secondary | ICD-10-CM | POA: Insufficient documentation

## 2015-03-07 DIAGNOSIS — Z79899 Other long term (current) drug therapy: Secondary | ICD-10-CM | POA: Diagnosis not present

## 2015-03-07 LAB — URINE MICROSCOPIC-ADD ON: BACTERIA UA: NONE SEEN

## 2015-03-07 LAB — CBC WITH DIFFERENTIAL/PLATELET
BASOS ABS: 0 10*3/uL (ref 0.0–0.1)
BASOS PCT: 0 %
EOS ABS: 0.5 10*3/uL (ref 0.0–0.7)
EOS PCT: 6 %
HCT: 47.6 % (ref 39.0–52.0)
Hemoglobin: 16.8 g/dL (ref 13.0–17.0)
Lymphocytes Relative: 43 %
Lymphs Abs: 3.6 10*3/uL (ref 0.7–4.0)
MCH: 28.9 pg (ref 26.0–34.0)
MCHC: 35.3 g/dL (ref 30.0–36.0)
MCV: 81.8 fL (ref 78.0–100.0)
Monocytes Absolute: 0.5 10*3/uL (ref 0.1–1.0)
Monocytes Relative: 6 %
NEUTROS PCT: 45 %
Neutro Abs: 3.9 10*3/uL (ref 1.7–7.7)
PLATELETS: 217 10*3/uL (ref 150–400)
RBC: 5.82 MIL/uL — AB (ref 4.22–5.81)
RDW: 13.5 % (ref 11.5–15.5)
WBC: 8.6 10*3/uL (ref 4.0–10.5)

## 2015-03-07 LAB — URINALYSIS, ROUTINE W REFLEX MICROSCOPIC
Glucose, UA: NEGATIVE mg/dL
Ketones, ur: 15 mg/dL — AB
NITRITE: POSITIVE — AB
PROTEIN: 100 mg/dL — AB
Specific Gravity, Urine: 1.015 (ref 1.005–1.030)
pH: 5 (ref 5.0–8.0)

## 2015-03-07 LAB — BASIC METABOLIC PANEL
ANION GAP: 8 (ref 5–15)
BUN: 18 mg/dL (ref 6–20)
CO2: 28 mmol/L (ref 22–32)
Calcium: 9.4 mg/dL (ref 8.9–10.3)
Chloride: 103 mmol/L (ref 101–111)
Creatinine, Ser: 1.72 mg/dL — ABNORMAL HIGH (ref 0.61–1.24)
GFR, EST AFRICAN AMERICAN: 56 mL/min — AB (ref >60)
GFR, EST NON AFRICAN AMERICAN: 48 mL/min — AB (ref >60)
Glucose, Bld: 162 mg/dL — ABNORMAL HIGH (ref 65–99)
POTASSIUM: 4 mmol/L (ref 3.5–5.1)
SODIUM: 139 mmol/L (ref 135–145)

## 2015-03-07 NOTE — ED Notes (Signed)
Onset last night pt noted blood in urine x 1, x 3 today.  No dysuria, back or abd pain, urinary frequency or nausea/vomiting.  No other s/s noted.  No new medications or foods .

## 2015-03-07 NOTE — ED Provider Notes (Signed)
CSN: [id number]     Arrival date & time 03/07/15  1548 History   First MD Initiated Contact with Patient 03/07/15 1610     Chief Complaint  Patient presents with  . Hematuria     (Consider location/radiation/quality/duration/timing/severity/associated sxs/prior Treatment) HPI  Patient is a 39 year old male with past medical history significant for hypertension and diabetes, who presents to the emergency department with hematuria. Reports he noted dark red blood throughout his entire void yesterday and again today. States he has a prior history of kidney stones. Denies abdominal pain, back pain, nausea, vomiting, fevers, chills, dysuria. No trauma or injury to his abdomen or genitals. Reports difficulty with initiating of right over the past 2-3 months. States she's been running more for his diabetes, but denies recent marathons or myalgias.  Past Medical History  Diagnosis Date  . Headache   . Diabetes mellitus without complication (HCC)   . Hypertension    Past Surgical History  Procedure Laterality Date  . Ankle surgery Left   . Kidney stones     Family History  Problem Relation Age of Onset  . Diabetes Mother   . Diabetes Father   . High blood pressure Father    Social History  Substance Use Topics  . Smoking status: Current Every Day Smoker -- 10 years    Types: Cigars  . Smokeless tobacco: Never Used  . Alcohol Use: 0.6 oz/week    1 Shots of liquor per week     Comment: Rare    Review of Systems  Constitutional: Negative for fever and appetite change.  HENT: Negative for congestion.   Eyes: Negative for visual disturbance.  Respiratory: Negative for chest tightness.   Cardiovascular: Negative for chest pain.  Gastrointestinal: Negative for nausea, vomiting, abdominal pain and blood in stool.  Genitourinary: Positive for hematuria. Negative for dysuria, urgency, flank pain, decreased urine volume, discharge, penile swelling and testicular pain.  Musculoskeletal:  Negative for back pain.  Skin: Negative for rash.  Neurological: Negative for dizziness, seizures, weakness, light-headedness, numbness and headaches.  Psychiatric/Behavioral: Negative for behavioral problems.      Allergies  Review of patient's allergies indicates no known allergies.  Home Medications   Prior to Admission medications   Medication Sig Start Date End Date Taking? Authorizing Provider  Dulaglutide (TRULICITY) 0.75 MG/0.5ML SOPN Inject 0.75 mg into the skin once a week. 01/04/15   Romero BellingSean Ellison, MD  glipiZIDE (GLUCOTROL XL) 2.5 MG 24 hr tablet Take 1 tablet (2.5 mg total) by mouth daily with breakfast. 01/29/15   Romero BellingSean Ellison, MD  glucose blood (ONETOUCH VERIO) test strip 1 each by Other route daily. And lancets 1/day 01/29/15   Romero BellingSean Ellison, MD  losartan-hydrochlorothiazide (HYZAAR) 100-25 MG tablet TAKE 1 TABLET BY MOUTH ONCE A DAY TO CONTROL BLOOD PRESSURE 01/22/15   Historical Provider, MD  pioglitazone (ACTOS) 45 MG tablet Take 1 tablet (45 mg total) by mouth daily. 12/28/14   Romero BellingSean Ellison, MD   BP 128/82 mmHg  Pulse 80  Temp(Src) 97.7 F (36.5 C) (Oral)  Resp 15  Ht 5\' 9"  (1.753 m)  Wt 86.183 kg  BMI 28.05 kg/m2  SpO2 99% Physical Exam  Constitutional: He is oriented to person, place, and time. He appears well-developed and well-nourished. No distress.  HENT:  Head: Atraumatic.  Mouth/Throat: Oropharynx is clear and moist.  Eyes: Conjunctivae and EOM are normal.  Neck: Normal range of motion.  Cardiovascular: Regular rhythm, normal heart sounds and intact distal pulses.   Pulmonary/Chest:  Effort normal and breath sounds normal. No respiratory distress.  Abdominal: Soft. He exhibits no distension. There is no tenderness.  No CVA tenderness  Genitourinary: Testes normal and penis normal. Cremasteric reflex is present.  Musculoskeletal: Normal range of motion.  Neurological: He is alert and oriented to person, place, and time.  Skin: Skin is warm. No pallor.   Psychiatric: He has a normal mood and affect.    ED Course  Procedures (including critical care time) Labs Review Labs Reviewed  URINALYSIS, ROUTINE W REFLEX MICROSCOPIC (NOT AT [facility]) - Abnormal; Notable for the following:    Color, Urine RED (*)    APPearance TURBID (*)    Hgb urine dipstick LARGE (*)    Bilirubin Urine LARGE (*)    Ketones, ur 15 (*)    Protein, ur 100 (*)    Nitrite POSITIVE (*)    Leukocytes, UA MODERATE (*)    All other components within normal limits  CBC WITH DIFFERENTIAL/PLATELET - Abnormal; Notable for the following:    RBC 5.82 (*)    All other components within normal limits  BASIC METABOLIC PANEL - Abnormal; Notable for the following:    Glucose, Bld 162 (*)    Creatinine, Ser 1.72 (*)    GFR calc non Af Amer 48 (*)    GFR calc Af Amer 56 (*)    All other components within normal limits  URINE MICROSCOPIC-ADD ON - Abnormal; Notable for the following:    Squamous Epithelial / LPF 0-5 (*)    All other components within normal limits  URINE CULTURE    Imaging Review No results found. I have personally reviewed and evaluated these images and lab results as part of my medical decision-making.   EKG Interpretation None      MDM   Final diagnoses:  Hematuria  AKI (acute kidney injury) [facility])   Patient is a 39 year old male with past medical history significant for hypertension and diabetes, who presents to the emergency department with hematuria. On arrival no acute distress, not ill appearing. Afebrile, hemodynamically stable. Benign abdominal exam, unremarkable GU exam.  UA showed large RBCs, WBCs 6-30, no bacteria seen. Patient without signs of dysuria or urinary urgency or frequency. Will not treat as a urinary tract infection at this time.. Hemoglobin stable. Platelets 217. Cr elevated at 1.72, no recent Cr to compare. Doubt coagulopathy, no personal or family history of bleeding disorder. Not on any anticoagulants. No new medication  changes. Since the patient is not currently having any pain will not obtain any imaging to evaluate for nephrolithiasis. Patient's hematuria could be secondary to nonobstructing nephrolithiasis, neoplasm, BPH. Discussed the patient's critical picture with urology, Dr. Ronne Binning, who recommended no treatment or further imaging at this time. Urine sent for culture. Recommended follow-up with urology as an outpatient.  Patient stable for discharge home. Discussed follow-up with urology as an outpatient to evaluate for causes of hematuria. Discussed strict return precautions to the emergency department. Patient expressed understanding, no questions or concerns at time of discharge.    Corena Herter, MD 03/08/15 1445  Benjiman Core, MD 03/10/15 [phone]

## 2015-03-08 LAB — URINE CULTURE: Culture: NO GROWTH

## 2015-05-20 ENCOUNTER — Other Ambulatory Visit: Payer: Self-pay | Admitting: Nephrology

## 2015-05-20 DIAGNOSIS — N183 Chronic kidney disease, stage 3 unspecified: Secondary | ICD-10-CM

## 2015-05-26 ENCOUNTER — Other Ambulatory Visit: Payer: Managed Care, Other (non HMO)

## 2016-01-26 ENCOUNTER — Other Ambulatory Visit: Payer: Self-pay | Admitting: Endocrinology

## 2016-01-26 NOTE — Telephone Encounter (Signed)
Please refill x 1 Ov is due  

## 2016-03-01 ENCOUNTER — Other Ambulatory Visit: Payer: Self-pay | Admitting: Endocrinology

## 2016-03-01 NOTE — Telephone Encounter (Signed)
Please refill x 3 mos Ov is due 

## 2016-05-25 ENCOUNTER — Encounter (HOSPITAL_COMMUNITY): Payer: Self-pay

## 2016-05-25 ENCOUNTER — Emergency Department (HOSPITAL_COMMUNITY)
Admission: EM | Admit: 2016-05-25 | Discharge: 2016-05-25 | Disposition: A | Discharge status: Home / Self Care | Payer: Managed Care, Other (non HMO) | Attending: Emergency Medicine | Admitting: Emergency Medicine

## 2016-05-25 DIAGNOSIS — R1032 Left lower quadrant pain: Secondary | ICD-10-CM | POA: Insufficient documentation

## 2016-05-25 DIAGNOSIS — F17290 Nicotine dependence, other tobacco product, uncomplicated: Secondary | ICD-10-CM | POA: Insufficient documentation

## 2016-05-25 DIAGNOSIS — Z7984 Long term (current) use of oral hypoglycemic drugs: Secondary | ICD-10-CM | POA: Diagnosis not present

## 2016-05-25 DIAGNOSIS — I10 Essential (primary) hypertension: Secondary | ICD-10-CM | POA: Insufficient documentation

## 2016-05-25 DIAGNOSIS — R591 Generalized enlarged lymph nodes: Secondary | ICD-10-CM | POA: Diagnosis not present

## 2016-05-25 DIAGNOSIS — E119 Type 2 diabetes mellitus without complications: Secondary | ICD-10-CM | POA: Diagnosis not present

## 2016-05-25 LAB — CBC
HEMATOCRIT: 48 % (ref 39.0–52.0)
HEMOGLOBIN: 17 g/dL (ref 13.0–17.0)
MCH: 29 pg (ref 26.0–34.0)
MCHC: 35.4 g/dL (ref 30.0–36.0)
MCV: 81.9 fL (ref 78.0–100.0)
Platelets: 190 10*3/uL (ref 150–400)
RBC: 5.86 MIL/uL — ABNORMAL HIGH (ref 4.22–5.81)
RDW: 12.9 % (ref 11.5–15.5)
WBC: 14.1 10*3/uL — ABNORMAL HIGH (ref 4.0–10.5)

## 2016-05-25 LAB — BASIC METABOLIC PANEL
ANION GAP: 13 (ref 5–15)
BUN: 15 mg/dL (ref 6–20)
CALCIUM: 9.9 mg/dL (ref 8.9–10.3)
CO2: 26 mmol/L (ref 22–32)
Chloride: 100 mmol/L — ABNORMAL LOW (ref 101–111)
Creatinine, Ser: 1.66 mg/dL — ABNORMAL HIGH (ref 0.61–1.24)
GFR calc non Af Amer: 50 mL/min — ABNORMAL LOW (ref >60)
GFR, EST AFRICAN AMERICAN: 58 mL/min — AB (ref >60)
Glucose, Bld: 252 mg/dL — ABNORMAL HIGH (ref 65–99)
Potassium: 4.1 mmol/L (ref 3.5–5.1)
Sodium: 139 mmol/L (ref 135–145)

## 2016-05-25 MED ORDER — LIDOCAINE-EPINEPHRINE (PF) 2 %-1:200000 IJ SOLN
10.0000 mL | Freq: Once | INTRAMUSCULAR | Status: DC
  Filled 2016-05-25: qty 20

## 2016-05-25 MED ORDER — SULFAMETHOXAZOLE-TRIMETHOPRIM 800-160 MG PO TABS
1.0000 | ORAL_TABLET | Freq: Once | ORAL | Status: AC
  Administered 2016-05-25: 1 via ORAL
  Filled 2016-05-25: qty 1

## 2016-05-25 MED ORDER — SULFAMETHOXAZOLE-TRIMETHOPRIM 800-160 MG PO TABS: 1.0000 | ORAL_TABLET | Freq: Two times a day (BID) | ORAL | 0 refills | Status: AC

## 2016-05-25 NOTE — Discharge Instructions (Signed)
The left inguinal area appears infected likely in the lymph node. There is currently no evidence of abscess. Your blood sugar is elevated at 252. You started on antibiotic and this needs to be completed in its entirety. Please place warm compresses in the groin area. Recheck with Dr. a Osei-Bonsu tomorrow. Return here if you are worse especially  having severe swelling, high fever, or unable to keep down your patient

## 2016-05-25 NOTE — ED Triage Notes (Addendum)
Per Pt, Pt is coming from PCP with groin pain that started over a week ago. Pt reports that upon exam PCP thought it was a hernia. Sent over for evaluation. Denies any N/V/D or fever. Reports some chills.

## 2016-05-25 NOTE — ED Provider Notes (Addendum)
[facility]-EMERGENCY DEPT Provider Note   CSN: [id number] Arrival date & time: 05/25/16  1527     History   Chief Complaint Chief Complaint  Patient presents with  . Groin Pain     HPI Gerald Moody is a 41 y.o. male.  HPI  Gerald Moody is a 40 year old male with on insulin-dependent diabetes who presents today with complaints of left groin pain and swelling that has been increasing over the past 1-2 weeks. The patient has not noted any discharge from this area. It has gradually worsened over the time frame with increased swelling. He has not noted any increased swelling with increased intra-abdominal pressure, straining, lifting, or position. It does not reduce. He has not had pain in his testicle. He has not noted any urethral symptoms or urinary tract infection symptoms. He has not had any previous similar symptoms. He states he did have some chills yesterday but has not had any fever. He was seen by his primary care doctor and he was told that he needed to be evaluated for hernia and presents to the ED for this.  Past Medical History:  Diagnosis Date  . Diabetes mellitus without complication (HCC)   . Headache   . Hypertension     Patient Active Problem List   Diagnosis Date Noted  . Type 2 diabetes mellitus with renal manifestations (HCC) 12/28/2014  . HTN (hypertension) 12/28/2014  . Cluster headache 01/08/2013    Past Surgical History:  Procedure Laterality Date  . ANKLE SURGERY Left   . kidney stones         Home Medications    Prior to Admission medications   Medication Sig Start Date End Date Taking? Authorizing Provider  glipiZIDE (GLUCOTROL XL) 2.5 MG 24 hr tablet TAKE 1 TABLET BY ONCE DAILY WITH BREAKFAST 03/02/16  Yes Romero Belling, MD  glucose blood (ONETOUCH VERIO) test strip 1 each by Other route daily. And lancets 1/day 01/29/15  Yes Romero Belling, MD  losartan-hydrochlorothiazide (HYZAAR) 100-25 MG tablet Take 1 tablet by mouth daily. 08/27/15  Yes Historical  Provider, MD  Dulaglutide (TRULICITY) 0.75 MG/0.5ML SOPN Inject 0.75 mg into the skin once a week. Patient not taking: Reported on 05/25/2016 01/04/15   Romero Belling, MD  pioglitazone (ACTOS) 45 MG tablet Take 1 tablet (45 mg total) by mouth daily. Patient not taking: Reported on 05/25/2016 12/28/14   Romero Belling, MD    Family History Family History  Problem Relation Age of Onset  . Diabetes Mother   . Diabetes Father   . High blood pressure Father     Social History Social History  Substance Use Topics  . Smoking status: Current Every Day Smoker    Years: 10.00    Types: Cigars  . Smokeless tobacco: Never Used  . Alcohol use 0.6 oz/week    1 Shots of liquor per week     Comment: Rare     Allergies   Patient has no known allergies.   Review of Systems Review of Systems  Constitutional: Positive for chills.  HENT: Negative.   Eyes: Negative.   Respiratory: Negative.   Cardiovascular: Negative.   Gastrointestinal: Negative.   Genitourinary: Negative.   Musculoskeletal: Negative.   Allergic/Immunologic: Negative.   Neurological: Negative.   Hematological: Negative.   Psychiatric/Behavioral: Negative.   All other systems reviewed and are negative.    Physical Exam Updated Vital Signs BP 123/81   Pulse 77   Temp 98.2 F (36.8 C) (Oral)   Resp 16  Ht 5\' 10"  (1.778 m)   Wt 88 kg   SpO2 93%   BMI 27.84 kg/m   Physical Exam  Constitutional: He appears well-developed and well-nourished.  HENT:  Head: Normocephalic and atraumatic.  Eyes: EOM are normal. Pupils are equal, round, and reactive to light.  Neck: Normal range of motion.  Cardiovascular: Normal rate.   Pulmonary/Chest: Effort normal.  Abdominal: Soft. Bowel sounds are normal.  Genitourinary:  Genitourinary Comments: Left groin with swelling and ttp with firm nodular area 2 x3 cm most c.w. With lymphadenopathy, no redness, fluctuance or warmth.   Nursing note and vitals reviewed.    ED  Treatments / Results  Labs (all labs ordered are listed, but only abnormal results are displayed) Labs Reviewed  CBC - Abnormal; Notable for the following:       Result Value   WBC 14.1 (*)    RBC 5.86 (*)    All other components within normal limits  BASIC METABOLIC PANEL - Abnormal; Notable for the following:    Chloride 100 (*)    Glucose, Bld 252 (*)    Creatinine, Ser 1.66 (*)    GFR calc non Af Amer 50 (*)    GFR calc Af Amer 58 (*)    All other components within normal limits    EKG  EKG Interpretation None       Radiology No results found.  Procedures Procedures (including critical care time)  Medications Ordered in ED Medications  lidocaine-EPINEPHrine (XYLOCAINE W/EPI) 2 %-1:200000 (PF) injection 10 mL (10 mLs Infiltration Canceled Entry 05/25/16 2204)  sulfamethoxazole-trimethoprim (BACTRIM DS,SEPTRA DS) 800-160 MG per tablet 1 tablet (1 tablet Oral Given 05/25/16 2246)     Initial Impression / Assessment and Plan / ED Course  I have reviewed the triage vital signs and the nursing notes.  Pertinent labs & imaging results that were available during my care of the patient were reviewed by me and considered in my medical decision making (see chart for details).     EMERGENCY DEPARTMENT US SOFT TISSUE INTERPRETATION "Study: Limited Soft Tissue Ultrasound"  INDICATIONS: Pain Multiple views of the body part were obtained in real-time with a multi-frequency linear probe  PERFORMED BY: Myself IMAGES ARCHIVED?: Yes SIDE:Left BODY PART:Pelvic wall INTERPRETATION:  No abcess noted  Left inguinal swelling, redness, and tenderness. Ultrasound does not show any evidence of fluid collection. Plan Bactrim, warm compresses, and close follow-up. Patient had labs drawn as he is a non-insulin-dependent diabetic and blood sugars noted to be elevated to 252. We have discussed mother close monitoring. White blood cell count is also elevated, both consistent with infection.  He has discussed need for close follow-up and return precautions and he voices understanding.  Final Clinical Impressions(s) / ED Diagnoses   Final diagnoses:  Lymphadenopathy  Left inguinal pain    New Prescriptions New Prescriptions   No medications on file     Margarita Grizzleanielle Adlee Paar, MD 05/25/16 2314    Margarita Grizzleanielle Coutney Wildermuth, MD 06/15/16 1515

## 2016-05-28 ENCOUNTER — Encounter (HOSPITAL_BASED_OUTPATIENT_CLINIC_OR_DEPARTMENT_OTHER): Payer: Self-pay | Admitting: Emergency Medicine

## 2016-05-28 ENCOUNTER — Emergency Department (HOSPITAL_BASED_OUTPATIENT_CLINIC_OR_DEPARTMENT_OTHER)
Admission: EM | Admit: 2016-05-28 | Discharge: 2016-05-28 | Disposition: A | Discharge status: Home / Self Care | Payer: Managed Care, Other (non HMO) | Attending: Physician Assistant | Admitting: Physician Assistant

## 2016-05-28 DIAGNOSIS — F17290 Nicotine dependence, other tobacco product, uncomplicated: Secondary | ICD-10-CM | POA: Diagnosis not present

## 2016-05-28 DIAGNOSIS — Z79899 Other long term (current) drug therapy: Secondary | ICD-10-CM | POA: Diagnosis not present

## 2016-05-28 DIAGNOSIS — I10 Essential (primary) hypertension: Secondary | ICD-10-CM | POA: Diagnosis not present

## 2016-05-28 DIAGNOSIS — E119 Type 2 diabetes mellitus without complications: Secondary | ICD-10-CM | POA: Insufficient documentation

## 2016-05-28 DIAGNOSIS — R1909 Other intra-abdominal and pelvic swelling, mass and lump: Secondary | ICD-10-CM | POA: Diagnosis present

## 2016-05-28 DIAGNOSIS — L0291 Cutaneous abscess, unspecified: Secondary | ICD-10-CM

## 2016-05-28 DIAGNOSIS — L02214 Cutaneous abscess of groin: Secondary | ICD-10-CM | POA: Diagnosis not present

## 2016-05-28 DIAGNOSIS — Z7984 Long term (current) use of oral hypoglycemic drugs: Secondary | ICD-10-CM | POA: Insufficient documentation

## 2016-05-28 MED ORDER — LIDOCAINE HCL 1 % IJ SOLN
INTRAMUSCULAR | Status: AC
  Administered 2016-05-28: 20 mL
  Filled 2016-05-28: qty 20

## 2016-05-28 MED ORDER — LIDOCAINE HCL (PF) 1 % IJ SOLN: 30.0000 mL | Freq: Once | INTRAMUSCULAR | Status: DC

## 2016-05-28 NOTE — Discharge Instructions (Signed)
Please continue taking antibiotics as prescribed. Please use warm compresses 3-4 times a day. Please continue to massage to get the purulent liquid out. Please return if you have any increase in symptoms or fevers or concerns.

## 2016-05-28 NOTE — ED Triage Notes (Signed)
Was eval at [facility] on 05/25/16 for swollen lymph node and started on bactrim. Wants to have it rechecked because it is not getting any better. Pain and swelling to left groin.

## 2016-05-28 NOTE — ED Notes (Signed)
ED Provider at bedside. 

## 2016-05-28 NOTE — ED Provider Notes (Signed)
[facility]   CSN: [id number] Arrival date & time: 05/28/16  1137     History   Chief Complaint Chief Complaint  Patient presents with  . Groin Swelling    HPI Gerald Moody is a 41 y.o. male.  HPI    Patient presented with swelling to his left groin. Patient seen at [facility] several days ago for swelling here. He initially saw his primary care that was a hernia [facility]. Nose: ultrasound done which  that was thought to be a lymph node. Patient treated with antibiotics and sent home and with actions to follow-up. Patient had no fevers nausea vomiting diarrhea or systemic symptoms. Past Medical History:  Diagnosis Date  . Diabetes mellitus without complication (HCC)   . Headache   . Hypertension     Patient Active Problem List   Diagnosis Date Noted  . Type 2 diabetes mellitus with renal manifestations (HCC) 12/28/2014  . HTN (hypertension) 12/28/2014  . Cluster headache 01/08/2013    Past Surgical History:  Procedure Laterality Date  . ANKLE SURGERY Left   . kidney stones         Home Medications    Prior to Admission medications   Medication Sig Start Date End Date Taking? Authorizing Provider  glipiZIDE (GLUCOTROL XL) 2.5 MG 24 hr tablet TAKE 1 TABLET BY ONCE DAILY WITH BREAKFAST 03/02/16  Yes Romero BellingSean Ellison, MD  losartan-hydrochlorothiazide (HYZAAR) 100-25 MG tablet Take 1 tablet by mouth daily. 08/27/15  Yes Historical Provider, MD  sulfamethoxazole-trimethoprim (BACTRIM DS,SEPTRA DS) 800-160 MG tablet Take 1 tablet by mouth 2 (two) times daily. 05/25/16 06/01/16 Yes Margarita Grizzleanielle Ray, MD  Dulaglutide (TRULICITY) 0.75 MG/0.5ML SOPN Inject 0.75 mg into the skin once a week. Patient not taking: Reported on 05/25/2016 01/04/15   Romero BellingSean Ellison, MD  glucose blood [facility](ONETOUCH VERIO) test strip 1 each by Other route daily. And lancets 1/day 01/29/15   Romero BellingSean Ellison, MD  pioglitazone (ACTOS) 45 MG tablet Take 1 tablet (45 mg total) by mouth  daily. Patient not taking: Reported on 05/25/2016 12/28/14   Romero BellingSean Ellison, MD    Family History Family History  Problem Relation Age of Onset  . Diabetes Mother   . Diabetes Father   . High blood pressure Father     Social History Social History  Substance Use Topics  . Smoking status: Current Every Day Smoker    Years: 10.00    Types: Cigars  . Smokeless tobacco: Never Used  . Alcohol use 0.6 oz/week    1 Shots of liquor per week     Comment: Rare     Allergies   Patient has no known allergies.   Review of Systems Review of Systems  Constitutional: Negative for activity change, fatigue and fever.  Respiratory: Negative for shortness of breath.   Cardiovascular: Negative for chest pain.  Gastrointestinal: Negative for abdominal pain.  All other systems reviewed and are negative.    Physical Exam Updated Vital Signs BP 111/71 (BP Location: Right Arm)   Pulse 91   Temp 98.3 F (36.8 C) (Oral)   Resp 16   Ht 5\' 10"  (1.778 m)   Wt 194 lb (88 kg)   SpO2 96%   BMI 27.84 kg/m   Physical Exam  Constitutional: He is oriented to person, place, and time. He appears well-nourished.  HENT:  Head: Normocephalic.  Eyes: Conjunctivae are normal.  Cardiovascular: Normal rate and regular rhythm.   Pulmonary/Chest: Effort normal and  breath sounds normal. No respiratory distress.  Genitourinary:  Genitourinary Comments: Swelling to left groin, tender abscess like finding.  Neurological: He is oriented to person, place, and time.  Skin: Skin is warm and dry. He is not diaphoretic.  Psychiatric: He has a normal mood and affect. His behavior is normal.     ED Treatments / Results  Labs (all labs ordered are listed, but only abnormal results are displayed) Labs Reviewed - No data to display  EKG  EKG Interpretation None       Radiology No results found.  Procedures Procedures (including critical care time)  Medications Ordered in ED Medications  lidocaine  (XYLOCAINE) 1 % (with pres) injection (20 mLs  Given 05/28/16 1221)     Initial Impression / Assessment and Plan / ED Course  I have reviewed the triage vital signs and the nursing notes.  Pertinent labs & imaging results that were available during my care of the patient were reviewed by me and considered in my medical decision making (see chart for details). EMERGENCY DEPARTMENT US SOFT TISSUE INTERPRETATION "Study: Limited Soft Tissue Ultrasound"  INDICATIONS: Pain and Soft tissue infection Multiple views of the body part were obtained in real-time with a multi-frequency linear probe PERFORMED BY:  Myself IMAGES ARCHIVED?: Yes SIDE:Left BODY PART:Abdominal wall FINDINGS: Abcess present INTERPRETATION:  Abcess present   INCISION AND DRAINAGE Performed by: Arlana Hove Consent: Verbal consent obtained. Risks and benefits: risks, benefits and alternatives were discussed Type: abscess  Body area: L groin    Anesthesia: local infiltration  Incision was made with a scalpel.  Local anesthetic: lidocaine 1 w epinephrine  Anesthetic total: 6 ml  Complexity: complex Blunt dissection to break up loculations  Drainage: purulent  Drainage amount: sig purulent   Patient tolerance: Patient tolerated the procedure well with no immediate complications.     Pt here with swelling to L groin. US done, clear abscess.   Abscess drained.  No obvious cellultits. Will have him contineu abx.  Return precautions given.   Final Clinical Impressions(s) / ED Diagnoses   Final diagnoses:  None    New Prescriptions New Prescriptions   No medications on file     Tykee Heideman Randall An, MD 05/28/16 1238

## 2016-05-29 ENCOUNTER — Other Ambulatory Visit: Payer: Self-pay | Admitting: Endocrinology

## 2020-06-25 ENCOUNTER — Other Ambulatory Visit: Payer: Self-pay

## 2020-06-25 ENCOUNTER — Emergency Department (HOSPITAL_BASED_OUTPATIENT_CLINIC_OR_DEPARTMENT_OTHER)
Admission: EM | Admit: 2020-06-25 | Discharge: 2020-06-25 | Disposition: A | Discharge status: Home / Self Care | Payer: Managed Care, Other (non HMO) | Attending: Emergency Medicine | Admitting: Emergency Medicine

## 2020-06-25 ENCOUNTER — Encounter (HOSPITAL_BASED_OUTPATIENT_CLINIC_OR_DEPARTMENT_OTHER): Payer: Self-pay | Admitting: *Deleted

## 2020-06-25 DIAGNOSIS — R519 Headache, unspecified: Secondary | ICD-10-CM | POA: Diagnosis present

## 2020-06-25 DIAGNOSIS — Z79899 Other long term (current) drug therapy: Secondary | ICD-10-CM | POA: Insufficient documentation

## 2020-06-25 DIAGNOSIS — E119 Type 2 diabetes mellitus without complications: Secondary | ICD-10-CM | POA: Diagnosis not present

## 2020-06-25 DIAGNOSIS — R110 Nausea: Secondary | ICD-10-CM | POA: Insufficient documentation

## 2020-06-25 DIAGNOSIS — Z7984 Long term (current) use of oral hypoglycemic drugs: Secondary | ICD-10-CM | POA: Diagnosis not present

## 2020-06-25 DIAGNOSIS — I10 Essential (primary) hypertension: Secondary | ICD-10-CM | POA: Insufficient documentation

## 2020-06-25 DIAGNOSIS — F17290 Nicotine dependence, other tobacco product, uncomplicated: Secondary | ICD-10-CM | POA: Insufficient documentation

## 2020-06-25 DIAGNOSIS — Z794 Long term (current) use of insulin: Secondary | ICD-10-CM | POA: Diagnosis not present

## 2020-06-25 LAB — CBC WITH DIFFERENTIAL/PLATELET
Abs Immature Granulocytes: 0.03 10*3/uL (ref 0.00–0.07)
Basophils Absolute: 0.1 10*3/uL (ref 0.0–0.1)
Basophils Relative: 1 %
Eosinophils Absolute: 0.2 10*3/uL (ref 0.0–0.5)
Eosinophils Relative: 2 %
HCT: 47.5 % (ref 39.0–52.0)
Hemoglobin: 17 g/dL (ref 13.0–17.0)
Immature Granulocytes: 0 %
Lymphocytes Relative: 29 %
Lymphs Abs: 2.9 10*3/uL (ref 0.7–4.0)
MCH: 29.4 pg (ref 26.0–34.0)
MCHC: 35.8 g/dL (ref 30.0–36.0)
MCV: 82 fL (ref 80.0–100.0)
Monocytes Absolute: 0.7 10*3/uL (ref 0.1–1.0)
Monocytes Relative: 7 %
Neutro Abs: 5.9 10*3/uL (ref 1.7–7.7)
Neutrophils Relative %: 61 %
Platelets: 236 10*3/uL (ref 150–400)
RBC: 5.79 MIL/uL (ref 4.22–5.81)
RDW: 13.2 % (ref 11.5–15.5)
WBC: 9.8 10*3/uL (ref 4.0–10.5)
nRBC: 0 % (ref 0.0–0.2)

## 2020-06-25 LAB — BASIC METABOLIC PANEL
Anion gap: 11 (ref 5–15)
BUN: 23 mg/dL — ABNORMAL HIGH (ref 6–20)
CO2: 22 mmol/L (ref 22–32)
Calcium: 9.5 mg/dL (ref 8.9–10.3)
Chloride: 104 mmol/L (ref 98–111)
Creatinine, Ser: 1.52 mg/dL — ABNORMAL HIGH (ref 0.61–1.24)
GFR, Estimated: 58 mL/min — ABNORMAL LOW (ref >60)
Glucose, Bld: 150 mg/dL — ABNORMAL HIGH (ref 70–99)
Potassium: 3.7 mmol/L (ref 3.5–5.1)
Sodium: 137 mmol/L (ref 135–145)

## 2020-06-25 MED ORDER — DIPHENHYDRAMINE HCL 50 MG/ML IJ SOLN
25.0000 mg | Freq: Once | INTRAMUSCULAR | Status: AC
  Administered 2020-06-25: 25 mg via INTRAVENOUS
  Filled 2020-06-25: qty 1

## 2020-06-25 MED ORDER — PROCHLORPERAZINE EDISYLATE 10 MG/2ML IJ SOLN
10.0000 mg | Freq: Once | INTRAMUSCULAR | Status: AC
  Administered 2020-06-25: 10 mg via INTRAVENOUS
  Filled 2020-06-25: qty 2

## 2020-06-25 MED ORDER — DIVALPROEX SODIUM ER 500 MG PO TB24: 500.0000 mg | ORAL_TABLET | Freq: Every day | ORAL | 0 refills | Status: AC

## 2020-06-25 MED ORDER — KETOROLAC TROMETHAMINE 15 MG/ML IJ SOLN
15.0000 mg | Freq: Once | INTRAMUSCULAR | Status: AC
  Administered 2020-06-25: 15 mg via INTRAVENOUS
  Filled 2020-06-25: qty 1

## 2020-06-25 NOTE — ED Triage Notes (Signed)
States began having headaches, dx of cluster headaches. This is now occurring more in the past 8 weeks, having nausea and vomiting. Do visual issues.

## 2020-06-25 NOTE — ED Provider Notes (Signed)
[facility] EMERGENCY DEPARTMENT Provider Note   CSN: [id number] Arrival date & time: 06/25/20  7353     History Chief Complaint  Patient presents with  . Headache    Gerald Moody is a 45 y.o. male.  Patient presents chief complaint of headaches.  He states that he has had a history of headaches for 7 years plus time.  He was diagnosed with cluster headaches by neurologists.  He states he took multiple medications many years ago which improved his headaches and they have resolved.  However over the last month they have returned in similar fashion.  There similar to prior headaches, described left-sided headache throbbing in nature causing tearing in the right eye.  These are associated with nausea.  He took Excedrin at home without improvement does not recall the medications he was on years ago for his headaches by his neurologist.  He has an appointment with neurology scheduled for next month.  Otherwise denies fevers, cough or diarrhea.         Past Medical History:  Diagnosis Date  . Diabetes mellitus without complication (HCC)   . Headache   . Hypertension     Patient Active Problem List   Diagnosis Date Noted  . Type 2 diabetes mellitus with renal manifestations (HCC) 12/28/2014  . HTN (hypertension) 12/28/2014  . Cluster headache 01/08/2013    Past Surgical History:  Procedure Laterality Date  . ANKLE SURGERY Left   . kidney stones         Family History  Problem Relation Age of Onset  . Diabetes Mother   . Diabetes Father   . High blood pressure Father     Social History   Tobacco Use  . Smoking status: Current Every Day Smoker    Years: 10.00    Types: Cigars  . Smokeless tobacco: Never Used  Substance Use Topics  . Alcohol use: Yes    Alcohol/week: 1.0 standard drink    Types: 1 Shots of liquor per week    Comment: Rare  . Drug use: No    Home Medications Prior to Admission medications   Medication Sig Start Date End Date Taking?  Authorizing Provider  divalproex (DEPAKOTE ER) 500 MG 24 hr tablet Take 1 tablet (500 mg total) by mouth daily. 06/25/20  Yes Giannie Soliday, Eustace Moore, MD  Dulaglutide (TRULICITY) 0.75 MG/0.5ML SOPN Inject 0.75 mg into the skin once a week. Patient not taking: Reported on 05/25/2016 01/04/15   Romero Belling, MD  glipiZIDE (GLUCOTROL XL) 2.5 MG 24 hr tablet TAKE 1 TABLET BY ONCE DAILY WITH BREAKFAST 03/02/16   Romero Belling, MD  glucose blood [facility] VERIO) test strip 1 each by Other route daily. And lancets 1/day 01/29/15   Romero Belling, MD  losartan-hydrochlorothiazide (HYZAAR) 100-25 MG tablet Take 1 tablet by mouth daily. 08/27/15   [provider]  pioglitazone (ACTOS) 45 MG tablet Take 1 tablet (45 mg total) by mouth daily. Patient not taking: Reported on 05/25/2016 12/28/14   Romero Belling, MD    Allergies    Patient has no known allergies.  Review of Systems   Review of Systems  Constitutional: Negative for fever.  HENT: Negative for ear pain and sore throat.   Eyes: Negative for pain.  Respiratory: Negative for cough.   Cardiovascular: Negative for chest pain.  Gastrointestinal: Negative for abdominal pain.  Genitourinary: Negative for flank pain.  Musculoskeletal: Negative for back pain.  Skin: Negative for color change and rash.  Neurological: Positive  for headaches. Negative for syncope.  All other systems reviewed and are negative.   Physical Exam Updated Vital Signs BP 137/88 (BP Location: Right Arm)   Pulse (!) 58   Temp 98.1 F (36.7 C) (Oral)   Resp 18   Ht 5\' 9"  (1.753 m)   Wt 90.7 kg   SpO2 100%   BMI 29.53 kg/m   Physical Exam Constitutional:      General: He is not in acute distress.    Appearance: He is well-developed.  HENT:     Head: Normocephalic.     Nose: Nose normal.  Eyes:     Extraocular Movements: Extraocular movements intact.  Cardiovascular:     Rate and Rhythm: Normal rate.  Pulmonary:     Effort: Pulmonary effort is normal.  Skin:     Coloration: Skin is not jaundiced.  Neurological:     Mental Status: He is alert. Mental status is at baseline.     Comments: Patient is awake alert oriented to time place history and to our conversation.  Cranial nerves II through XII are intact.  Normal facial sensation and strength,  Gait is normal unassisted.     ED Results / Procedures / Treatments   Labs (all labs ordered are listed, but only abnormal results are displayed) Labs Reviewed  BASIC METABOLIC PANEL - Abnormal; Notable for the following components:      Result Value   Glucose, Bld 150 (*)    BUN 23 (*)    Creatinine, Ser 1.52 (*)    GFR, Estimated 58 (*)    All other components within normal limits  CBC WITH DIFFERENTIAL/PLATELET    EKG None  Radiology No results found.  Procedures Procedures   Medications Ordered in ED Medications  prochlorperazine (COMPAZINE) injection 10 mg (10 mg Intravenous Given 06/25/20 0804)  ketorolac (TORADOL) 15 MG/ML injection 15 mg (15 mg Intravenous Given 06/25/20 0804)  diphenhydrAMINE (BENADRYL) injection 25 mg (25 mg Intravenous Given 06/25/20 0804)    ED Course  I have reviewed the triage vital signs and the nursing notes.  Pertinent labs & imaging results that were available during my care of the patient were reviewed by me and considered in my medical decision making (see chart for details).    MDM Rules/Calculators/A&P                          Patient given treatment here with Toradol Compazine Benadryl with some improvement.  We will restart him on medication his neurologist well for him in the past.  Advised him to call his neurologist office this week.  Advised immediate return for worsening symptoms fevers vomiting or any additional concerns.  Final Clinical Impression(s) / ED Diagnoses Final diagnoses:  Chronic nonintractable headache, unspecified headache type    Rx / DC Orders ED Discharge Orders         Ordered    divalproex (DEPAKOTE ER) 500 MG 24  hr tablet  Daily        06/25/20 0901           08/25/20, MD 06/25/20 [phone]

## 2020-06-25 NOTE — Discharge Instructions (Addendum)
Call your primary care doctor or specialist as discussed in the next 2-3 days.   Return immediately back to the ER if:  Your symptoms worsen within the next 12-24 hours. You develop new symptoms such as new fevers, persistent vomiting, new pain, shortness of breath, or new weakness or numbness, or if you have any other concerns.  

## 2020-06-25 NOTE — Progress Notes (Signed)
Placed PT on 3 LPM nasal cannula per RN request.

## 2023-08-07 ENCOUNTER — Encounter (HOSPITAL_BASED_OUTPATIENT_CLINIC_OR_DEPARTMENT_OTHER): Payer: Self-pay

## 2023-08-07 ENCOUNTER — Other Ambulatory Visit: Payer: Self-pay

## 2023-08-07 ENCOUNTER — Emergency Department (HOSPITAL_BASED_OUTPATIENT_CLINIC_OR_DEPARTMENT_OTHER)
Admission: EM | Admit: 2023-08-07 | Discharge: 2023-08-07 | Disposition: A | Discharge status: Home / Self Care | Attending: Emergency Medicine | Admitting: Emergency Medicine

## 2023-08-07 ENCOUNTER — Emergency Department (HOSPITAL_BASED_OUTPATIENT_CLINIC_OR_DEPARTMENT_OTHER)

## 2023-08-07 DIAGNOSIS — E119 Type 2 diabetes mellitus without complications: Secondary | ICD-10-CM | POA: Insufficient documentation

## 2023-08-07 DIAGNOSIS — Z79899 Other long term (current) drug therapy: Secondary | ICD-10-CM | POA: Diagnosis not present

## 2023-08-07 DIAGNOSIS — R0981 Nasal congestion: Secondary | ICD-10-CM

## 2023-08-07 DIAGNOSIS — I10 Essential (primary) hypertension: Secondary | ICD-10-CM | POA: Insufficient documentation

## 2023-08-07 DIAGNOSIS — J209 Acute bronchitis, unspecified: Secondary | ICD-10-CM | POA: Diagnosis not present

## 2023-08-07 DIAGNOSIS — Z7984 Long term (current) use of oral hypoglycemic drugs: Secondary | ICD-10-CM | POA: Insufficient documentation

## 2023-08-07 DIAGNOSIS — R059 Cough, unspecified: Secondary | ICD-10-CM | POA: Diagnosis present

## 2023-08-07 LAB — BASIC METABOLIC PANEL WITH GFR
Anion gap: 13 (ref 5–15)
BUN: 12 mg/dL (ref 6–20)
CO2: 23 mmol/L (ref 22–32)
Calcium: 9.3 mg/dL (ref 8.9–10.3)
Chloride: 104 mmol/L (ref 98–111)
Creatinine, Ser: 1.32 mg/dL — ABNORMAL HIGH (ref 0.61–1.24)
GFR, Estimated: >60 mL/min (ref >60)
Glucose, Bld: 148 mg/dL — ABNORMAL HIGH (ref 70–99)
Potassium: 3.7 mmol/L (ref 3.5–5.1)
Sodium: 141 mmol/L (ref 135–145)

## 2023-08-07 LAB — CBC WITH DIFFERENTIAL/PLATELET
Abs Immature Granulocytes: 0.03 10*3/uL (ref 0.00–0.07)
Basophils Absolute: 0.1 10*3/uL (ref 0.0–0.1)
Basophils Relative: 1 %
Eosinophils Absolute: 0.3 10*3/uL (ref 0.0–0.5)
Eosinophils Relative: 5 %
HCT: 44.3 % (ref 39.0–52.0)
Hemoglobin: 15.7 g/dL (ref 13.0–17.0)
Immature Granulocytes: 0 %
Lymphocytes Relative: 35 %
Lymphs Abs: 2.4 10*3/uL (ref 0.7–4.0)
MCH: 30 pg (ref 26.0–34.0)
MCHC: 35.4 g/dL (ref 30.0–36.0)
MCV: 84.5 fL (ref 80.0–100.0)
Monocytes Absolute: 0.5 10*3/uL (ref 0.1–1.0)
Monocytes Relative: 8 %
Neutro Abs: 3.4 10*3/uL (ref 1.7–7.7)
Neutrophils Relative %: 51 %
Platelets: 202 10*3/uL (ref 150–400)
RBC: 5.24 MIL/uL (ref 4.22–5.81)
RDW: 13.9 % (ref 11.5–15.5)
WBC: 6.7 10*3/uL (ref 4.0–10.5)
nRBC: 0 % (ref 0.0–0.2)

## 2023-08-07 LAB — RESP PANEL BY RT-PCR (RSV, FLU A&B, COVID)  RVPGX2
Influenza A by PCR: NEGATIVE
Influenza B by PCR: NEGATIVE
Resp Syncytial Virus by PCR: NEGATIVE
SARS Coronavirus 2 by RT PCR: NEGATIVE

## 2023-08-07 MED ORDER — BENZONATATE 100 MG PO CAPS
100.0000 mg | ORAL_CAPSULE | Freq: Once | ORAL | Status: AC
  Administered 2023-08-07: 100 mg via ORAL
  Filled 2023-08-07: qty 1

## 2023-08-07 MED ORDER — PREDNISONE 10 MG PO TABS: 40.0000 mg | ORAL_TABLET | Freq: Every day | ORAL | 0 refills | Status: AC

## 2023-08-07 MED ORDER — AZITHROMYCIN 250 MG PO TABS: 250.0000 mg | ORAL_TABLET | Freq: Every day | ORAL | 0 refills | Status: AC

## 2023-08-07 MED ORDER — BENZONATATE 100 MG PO CAPS: 100.0000 mg | ORAL_CAPSULE | Freq: Three times a day (TID) | ORAL | 0 refills | Status: AC

## 2023-08-07 NOTE — ED Triage Notes (Signed)
 Productive Cough, congestion, fatigue for 2 weeks. Denies SHOB, fevers. Reports chest pain when coughing

## 2023-08-07 NOTE — ED Provider Notes (Signed)
 [facility] [facility] AT [facility] Provider Note   CSN: [id number] Arrival date & time: 08/07/23  0703     History  Chief Complaint  Patient presents with   Cough    Gerald Moody is a 48 y.o. male.   Cough    48 year old male with medical history significant for diabetes mellitus, hypertension presenting to the emergency department with cough, nasal congestion and fatigue for the past 2 weeks.  He endorses seasonal allergies.  Has had a productive cough with associated discomfort when coughing, producing yellow sputum.  He endorses nasal congestion, no purulent nasal discharge.  Denies any fevers or chills.  Denies any shortness of breath.  Denies any known sick contacts, is tolerating oral intake.  He has been trying decongestants at night.  Home Medications Prior to Admission medications   Medication Sig Start Date End Date Taking? Authorizing Provider  azithromycin (ZITHROMAX) 250 MG tablet Take 1 tablet (250 mg total) by mouth daily. Take first 2 tablets together, then 1 every day until finished. 08/07/23  Yes Rosealee Concha, MD  benzonatate (TESSALON) 100 MG capsule Take 1 capsule (100 mg total) by mouth every 8 (eight) hours. 08/07/23  Yes Rosealee Concha, MD  predniSONE (DELTASONE) 10 MG tablet Take 4 tablets (40 mg total) by mouth daily for 5 days. 08/07/23 08/12/23 Yes Rosealee Concha, MD  divalproex  (DEPAKOTE  ER) 500 MG 24 hr tablet Take 1 tablet (500 mg total) by mouth daily. 06/25/20   Billie Budge, MD  Dulaglutide  (TRULICITY ) 0.75 MG/0.5ML SOPN Inject 0.75 mg into the skin once a week. Patient not taking: Reported on 05/25/2016 01/04/15   Gwyndolyn Lerner, MD  glipiZIDE  (GLUCOTROL  XL) 2.5 MG 24 hr tablet TAKE 1 TABLET BY ONCE DAILY WITH BREAKFAST 03/02/16   Gwyndolyn Lerner, MD  glucose blood [facility] VERIO) test strip 1 each by Other route daily. And lancets 1/day 01/29/15   Gwyndolyn Lerner, MD  losartan-hydrochlorothiazide (HYZAAR) 100-25 MG tablet Take 1 tablet  by mouth daily. 08/27/15   [provider]  pioglitazone  (ACTOS ) 45 MG tablet Take 1 tablet (45 mg total) by mouth daily. Patient not taking: Reported on 05/25/2016 12/28/14   Gwyndolyn Lerner, MD      Allergies    Patient has no known allergies.    Review of Systems   Review of Systems  HENT:  Positive for congestion.   Respiratory:  Positive for cough.   All other systems reviewed and are negative.   Physical Exam Updated Vital Signs BP (!) 155/101   Pulse 88   Temp 98.6 F (37 C) (Oral)   Resp 18   Ht 5\' 9"  (1.753 m)   Wt 90.7 kg   SpO2 99%   BMI 29.53 kg/m  Physical Exam Vitals and nursing note reviewed.  Constitutional:      General: He is not in acute distress.    Appearance: He is well-developed.  HENT:     Head: Normocephalic and atraumatic.     Comments: No sinus tenderness Eyes:     Conjunctiva/sclera: Conjunctivae normal.  Cardiovascular:     Rate and Rhythm: Normal rate and regular rhythm.  Pulmonary:     Effort: Pulmonary effort is normal. No respiratory distress.     Breath sounds: Normal breath sounds.  Abdominal:     Palpations: Abdomen is soft.     Tenderness: There is no abdominal tenderness.  Musculoskeletal:        General: No swelling.     Cervical  back: Neck supple.  Skin:    General: Skin is warm and dry.     Capillary Refill: Capillary refill takes less than 2 seconds.  Neurological:     Mental Status: He is alert.  Psychiatric:        Mood and Affect: Mood normal.     ED Results / Procedures / Treatments   Labs (all labs ordered are listed, but only abnormal results are displayed) Labs Reviewed  BASIC METABOLIC PANEL WITH GFR - Abnormal; Notable for the following components:      Result Value   Glucose, Bld 148 (*)    Creatinine, Ser 1.32 (*)    All other components within normal limits  RESP PANEL BY RT-PCR (RSV, FLU A&B, COVID)  [id number]  CBC WITH DIFFERENTIAL/PLATELET    EKG None  Radiology DG Chest 2 View Result  Date: 08/07/2023 CLINICAL DATA:  Cough for 2 weeks with fatigue and congestion.  Or EXAM: CHEST - 2 VIEW COMPARISON:  PA Lat chest 12/12/2012. FINDINGS: The heart size and mediastinal contours are within normal limits. Both lungs are clear. The visualized skeletal structures are unremarkable. IMPRESSION: No active cardiopulmonary disease. Electronically Signed   By: Denman Fischer M.D.   On: 08/07/2023 07:44    Procedures Procedures    Medications Ordered in ED Medications  benzonatate (TESSALON) capsule 100 mg (100 mg Oral Given 08/07/23 0809)    ED Course/ Medical Decision Making/ A&P                                 Medical Decision Making Amount and/or Complexity of Data Reviewed Labs: ordered. Radiology: ordered.  Risk Prescription drug management.     48 year old male with medical history significant for diabetes mellitus, hypertension presenting to the emergency department with cough, nasal congestion and fatigue for the past 2 weeks.  He endorses seasonal allergies.  Has had a productive cough with associated discomfort when coughing, producing yellow sputum.  He endorses nasal congestion, no purulent nasal discharge.  Denies any fevers or chills.  Denies any shortness of breath.  Denies any known sick contacts, is tolerating oral intake.  He has been trying decongestants at night.  On arrival, the patient was afebrile, not tachycardic or tachypneic, BP 155/101, saturating 99% on room air.  Physical exam generally unremarkable.  Patient presenting with symptoms of cough and congestion.  Considered seasonal allergies, viral infection, bacterial pneumonia.  Patient overall well-appearing, vitally stable, not meeting SIRS criteria at this time.  CXR: No acute cardiac or pulmonary disease, no focal consolidation to suggest pneumonia.  Labs: CBC without a leukocytosis or anemia, COVID, flu, RSV PCR testing negative, BMP with mildly elevated blood glucose to 148, no significant  electrolyte abnormality, creatinine appears to be at baseline for the patient's mild CKD at 1.32.  Patient symptoms are consistent with likely acute bronchitis which we will treat with cough suppressant, steroids and azithromycin.  Advised over-the-counter nasal saline and/or sinus rinse for congestion as well.  Return precautions provided in the event of any worsening symptoms, overall tolerating oral intake, well-appearing, vitally stable, stable for discharge and outpatient PCP follow-up as needed.   Final Clinical Impression(s) / ED Diagnoses Final diagnoses:  Acute bronchitis, unspecified organism  Nasal congestion    Rx / DC Orders ED Discharge Orders          Ordered    benzonatate (TESSALON) 100 MG capsule  Every 8  hours        08/07/23 0819    predniSONE (DELTASONE) 10 MG tablet  Daily        08/07/23 0819    azithromycin (ZITHROMAX) 250 MG tablet  Daily        08/07/23 0819              Rosealee Concha, MD 08/07/23 [phone]

## 2023-08-07 NOTE — Discharge Instructions (Addendum)
 Your chest x-ray did not show evidence of consolidation to suggest pneumonia, your COVID, flu and RSV PCR testing was negative.  Your labs did not reveal an elevated white blood cell count and your kidney function is at baseline.  Your symptoms are consistent with likely acute bronchitis versus seasonal allergies.  Will treat for acute bronchitis with a steroid, Z-Pak and cough suppressant medication.  If he have persistent nasal congestion, could try over-the-counter sinus rinse or nasal saline spray as well.
